# Patient Record
Sex: Male | Born: 1960 | Race: White | Hispanic: No | Marital: Married | Smoking: Former smoker
Health system: Southern US, Community
[De-identification: ages and names within clinical notes are randomized; demographics above are authoritative.]

## PROBLEM LIST (undated history)

## (undated) DIAGNOSIS — G459 Transient cerebral ischemic attack, unspecified: Secondary | ICD-10-CM

## (undated) DIAGNOSIS — I10 Essential (primary) hypertension: Secondary | ICD-10-CM

## (undated) DIAGNOSIS — M549 Dorsalgia, unspecified: Secondary | ICD-10-CM

## (undated) DIAGNOSIS — M67431 Ganglion, right wrist: Secondary | ICD-10-CM

## (undated) DIAGNOSIS — I700 Atherosclerosis of aorta: Secondary | ICD-10-CM

## (undated) DIAGNOSIS — R0600 Dyspnea, unspecified: Secondary | ICD-10-CM

## (undated) DIAGNOSIS — I639 Cerebral infarction, unspecified: Secondary | ICD-10-CM

## (undated) DIAGNOSIS — J45909 Unspecified asthma, uncomplicated: Secondary | ICD-10-CM

## (undated) DIAGNOSIS — F419 Anxiety disorder, unspecified: Secondary | ICD-10-CM

## (undated) DIAGNOSIS — G5601 Carpal tunnel syndrome, right upper limb: Secondary | ICD-10-CM

## (undated) DIAGNOSIS — R200 Anesthesia of skin: Secondary | ICD-10-CM

## (undated) DIAGNOSIS — G4730 Sleep apnea, unspecified: Secondary | ICD-10-CM

## (undated) DIAGNOSIS — F32A Depression, unspecified: Secondary | ICD-10-CM

## (undated) DIAGNOSIS — F329 Major depressive disorder, single episode, unspecified: Secondary | ICD-10-CM

## (undated) DIAGNOSIS — I2510 Atherosclerotic heart disease of native coronary artery without angina pectoris: Secondary | ICD-10-CM

## (undated) DIAGNOSIS — M47816 Spondylosis without myelopathy or radiculopathy, lumbar region: Secondary | ICD-10-CM

## (undated) DIAGNOSIS — E785 Hyperlipidemia, unspecified: Secondary | ICD-10-CM

## (undated) DIAGNOSIS — M1990 Unspecified osteoarthritis, unspecified site: Secondary | ICD-10-CM

## (undated) DIAGNOSIS — G43909 Migraine, unspecified, not intractable, without status migrainosus: Secondary | ICD-10-CM

## (undated) DIAGNOSIS — J189 Pneumonia, unspecified organism: Secondary | ICD-10-CM

## (undated) DIAGNOSIS — J309 Allergic rhinitis, unspecified: Secondary | ICD-10-CM

## (undated) DIAGNOSIS — J449 Chronic obstructive pulmonary disease, unspecified: Secondary | ICD-10-CM

## (undated) DIAGNOSIS — G8929 Other chronic pain: Secondary | ICD-10-CM

## (undated) HISTORY — PX: KNEE SURGERY: SHX244

## (undated) HISTORY — PX: LUMBAR DISC SURGERY: SHX700

## (undated) HISTORY — PX: LAMINECTOMY: SHX219

## (undated) HISTORY — PX: CARPAL TUNNEL RELEASE: SHX101

---

## 1980-02-24 HISTORY — PX: FACIAL RECONSTRUCTION SURGERY: SHX631

## 1990-02-23 HISTORY — PX: CARPAL TUNNEL RELEASE: SHX101

## 1993-02-23 HISTORY — PX: KNEE ARTHROSCOPY W/ MENISCAL REPAIR: SHX1877

## 2003-07-06 ENCOUNTER — Other Ambulatory Visit: Payer: Self-pay

## 2004-07-30 ENCOUNTER — Other Ambulatory Visit: Payer: Self-pay

## 2004-07-30 ENCOUNTER — Emergency Department: Payer: Self-pay | Admitting: Emergency Medicine

## 2005-07-23 ENCOUNTER — Inpatient Hospital Stay: Payer: Self-pay | Admitting: Internal Medicine

## 2005-07-23 ENCOUNTER — Other Ambulatory Visit: Payer: Self-pay

## 2005-08-07 ENCOUNTER — Ambulatory Visit: Payer: Self-pay | Admitting: Internal Medicine

## 2005-12-09 ENCOUNTER — Other Ambulatory Visit: Payer: Self-pay

## 2005-12-09 ENCOUNTER — Emergency Department: Payer: Self-pay | Admitting: Emergency Medicine

## 2006-05-10 ENCOUNTER — Emergency Department: Payer: Self-pay | Admitting: Emergency Medicine

## 2006-05-10 ENCOUNTER — Other Ambulatory Visit: Payer: Self-pay

## 2006-07-31 ENCOUNTER — Emergency Department: Payer: Self-pay | Admitting: Emergency Medicine

## 2006-07-31 ENCOUNTER — Other Ambulatory Visit: Payer: Self-pay

## 2006-10-03 ENCOUNTER — Emergency Department: Payer: Self-pay | Admitting: Emergency Medicine

## 2006-10-03 ENCOUNTER — Other Ambulatory Visit: Payer: Self-pay

## 2007-07-06 ENCOUNTER — Other Ambulatory Visit: Payer: Self-pay

## 2007-07-06 ENCOUNTER — Emergency Department: Payer: Self-pay | Admitting: Emergency Medicine

## 2007-07-24 ENCOUNTER — Emergency Department: Payer: Self-pay | Admitting: Emergency Medicine

## 2007-07-26 ENCOUNTER — Emergency Department: Payer: Self-pay | Admitting: Emergency Medicine

## 2007-09-01 ENCOUNTER — Emergency Department: Payer: Self-pay | Admitting: Emergency Medicine

## 2007-10-05 ENCOUNTER — Emergency Department: Payer: Self-pay | Admitting: Emergency Medicine

## 2008-02-24 HISTORY — PX: LUMBAR DISC SURGERY: SHX700

## 2008-07-06 ENCOUNTER — Emergency Department: Payer: Self-pay | Admitting: Internal Medicine

## 2009-04-11 ENCOUNTER — Ambulatory Visit: Payer: Self-pay | Admitting: Family Medicine

## 2010-04-23 ENCOUNTER — Inpatient Hospital Stay: Payer: Self-pay | Admitting: Specialist

## 2010-04-28 ENCOUNTER — Ambulatory Visit: Payer: Self-pay | Admitting: Specialist

## 2010-06-16 ENCOUNTER — Emergency Department: Payer: Self-pay | Admitting: Emergency Medicine

## 2010-09-11 ENCOUNTER — Ambulatory Visit: Payer: Self-pay | Admitting: Family Medicine

## 2010-10-13 ENCOUNTER — Emergency Department: Payer: Self-pay | Admitting: Emergency Medicine

## 2012-11-10 ENCOUNTER — Emergency Department: Payer: Self-pay | Admitting: Emergency Medicine

## 2012-11-20 ENCOUNTER — Emergency Department: Payer: Self-pay | Admitting: Emergency Medicine

## 2013-02-23 DIAGNOSIS — G459 Transient cerebral ischemic attack, unspecified: Secondary | ICD-10-CM

## 2013-02-23 HISTORY — DX: Transient cerebral ischemic attack, unspecified: G45.9

## 2013-10-12 ENCOUNTER — Emergency Department: Payer: Self-pay | Admitting: Emergency Medicine

## 2014-10-21 ENCOUNTER — Emergency Department
Admission: EM | Admit: 2014-10-21 | Discharge: 2014-10-21 | Disposition: A | Discharge status: Home / Self Care | Payer: Self-pay | Attending: Emergency Medicine | Admitting: Emergency Medicine

## 2014-10-21 DIAGNOSIS — H6091 Unspecified otitis externa, right ear: Secondary | ICD-10-CM | POA: Insufficient documentation

## 2014-10-21 DIAGNOSIS — J449 Chronic obstructive pulmonary disease, unspecified: Secondary | ICD-10-CM | POA: Insufficient documentation

## 2014-10-21 MED ORDER — CIPROFLOXACIN HCL 500 MG PO TABS: 500.0000 mg | ORAL_TABLET | Freq: Two times a day (BID) | ORAL | Status: AC

## 2014-10-21 MED ORDER — NEOMYCIN-POLYMYXIN-HC 3.5-10000-1 OT SOLN: 3.0000 [drp] | Freq: Three times a day (TID) | OTIC | Status: AC

## 2014-10-21 MED ORDER — CIPROFLOXACIN HCL 500 MG PO TABS
500.0000 mg | ORAL_TABLET | Freq: Once | ORAL | Status: AC
  Administered 2014-10-21: 500 mg via ORAL
  Filled 2014-10-21: qty 1

## 2014-10-21 MED ORDER — IPRATROPIUM-ALBUTEROL 0.5-2.5 (3) MG/3ML IN SOLN
3.0000 mL | Freq: Once | RESPIRATORY_TRACT | Status: AC
  Administered 2014-10-21: 3 mL via RESPIRATORY_TRACT
  Filled 2014-10-21: qty 3

## 2014-10-21 MED ORDER — NEOMYCIN-POLYMYXIN-HC 3.5-10000-1 OT SUSP
OTIC | Status: AC
  Administered 2014-10-21: 06:00:00
  Filled 2014-10-21: qty 10

## 2014-10-21 MED ORDER — NEOMYCIN-COLIST-HC-THONZONIUM 3.3-3-10-0.5 MG/ML OT SUSP: 4.0000 [drp] | Freq: Once | OTIC | Status: DC

## 2014-10-21 MED ORDER — ALBUTEROL SULFATE HFA 108 (90 BASE) MCG/ACT IN AERS: 2.0000 | INHALATION_SPRAY | Freq: Four times a day (QID) | RESPIRATORY_TRACT | Status: DC | PRN

## 2014-10-21 NOTE — ED Provider Notes (Signed)
[facility] Emergency Department Provider Note  ____________________________________________  Time seen: Approximately 5:28 AM  I have reviewed the triage vital signs and the nursing notes.   HISTORY  Chief Complaint Otalgia and Cough   HPI Bruce Harding is a 54 y.o. male patient reports several days of right ear pain. He also has a cough which is not productive now he says in the past occasionally been productive of some phlegm. He has COPD. He does not have a fever.    No past medical history on file. past medical history significant for COPD   There are no active problems to display for this patient.   No past surgical history on file.  Current Outpatient Rx  Name  Route  Sig  Dispense  Refill  . albuterol (PROVENTIL HFA;VENTOLIN HFA) 108 (90 BASE) MCG/ACT inhaler   Inhalation   Inhale 2 puffs into the lungs every 6 (six) hours as needed for wheezing or shortness of breath.   1 Inhaler   2   . ciprofloxacin (CIPRO) 500 MG tablet   Oral   Take 1 tablet (500 mg total) by mouth 2 (two) times daily.   20 tablet   0   . neomycin-polymyxin-hydrocortisone (CORTISPORIN) otic solution   Left Ear   Place 3 drops into the left ear 3 (three) times daily.   10 mL   0     Allergies Review of patient's allergies indicates no known allergies.  No family history on file.  Social History Social History  Substance Use Topics  . Smoking status: Not on file  . Smokeless tobacco: Not on file  . Alcohol Use: Not on file   Patient reports he smokes  Review of Systems Constitutional: No fever/chills Eyes: No visual changes. ENT: No sore throat. Cardiovascular: Denies chest pain. Respiratory: Denies  unusual shortness of breath. Gastrointestinal: No abdominal pain.  No nausea, no vomiting.  No diarrhea.  No constipation. Genitourinary: Negative for dysuria. Musculoskeletal: Negative for back pain. Skin: Negative for rash. Neurological: Negative  for headaches, focal weakness or numbness.  10-point ROS otherwise negative.  ____________________________________________   PHYSICAL EXAM:  VITAL SIGNS: ED Triage Vitals  Enc Vitals Group     BP 10/21/14 0410 123/80 mmHg     Pulse Rate 10/21/14 0410 80     Resp 10/21/14 0410 18     Temp 10/21/14 0410 98 F (36.7 C)     Temp Source 10/21/14 0410 Oral     SpO2 10/21/14 0410 95 %     Weight 10/21/14 0410 167 lb (75.751 kg)     Height 10/21/14 0410 5\' 5"  (1.651 m)     Head Cir --      Peak Flow --      Pain Score 10/21/14 0408 8     Pain Loc --      Pain Edu? --      Excl. in GC? --   Constitutional: Alert and oriented. Well appearing and in no acute distress. Eyes: Conjunctivae are normal. PERRL. EOMI. Head: Atraumatic. Nose: No congestion/rhinnorhea. Ears left TM is normal right TM is normal as well however the right ear canal is swollen the ears tender to traction is also some swelling around the ear  Mouth/Throat: Mucous membranes are moist.  Oropharynx non-erythematous. Neck: No stridor. Cardiovascular: Normal rate, regular rhythm. Grossly normal heart sounds.  Good peripheral circulation. Respiratory: Normal respiratory effort.  No retractions. Lungs CTAB. Gastrointestinal: Soft and nontender. No distention. No abdominal  bruits. No CVA tenderness. Musculoskeletal: No lower extremity tenderness nor edema.  No joint effusions. Neurologic:  Normal speech and language. No gross focal neurologic deficits are appreciated. No gait instability. Skin:  Skin is warm, dry and intact. No rash noted. Psychiatric: Mood and affect are normal. Speech and behavior are normal.  ____________________________________________   LABS (all labs ordered are listed, but only abnormal results are displayed)  Labs Reviewed - No data to  display ____________________________________________  EKG   ____________________________________________  RADIOLOGY   ____________________________________________   PROCEDURES    ____________________________________________   INITIAL IMPRESSION / ASSESSMENT AND PLAN / ED COURSE  Pertinent labs & imaging results that were available during my care of the patient were reviewed by me and considered in my medical decision making (see chart for details).  I discussed mostly smoking sensation with the patient. I discussed using a wick and demonstrated the same with the patient telling him to change the wick every day and make it out of cotton or tissue paper ____________________________________________   FINAL CLINICAL IMPRESSION(S) / ED DIAGNOSES  Final diagnoses:  Otitis externa, right  Chronic obstructive pulmonary disease, unspecified COPD, unspecified chronic bronchitis type      Arnaldo Natal, MD 10/21/14 0530

## 2014-10-21 NOTE — ED Notes (Signed)
Pt c/o right earache for 3 days;

## 2014-10-21 NOTE — ED Notes (Signed)
Pt. States rt. Ear pain for past 3 days.  Pt. States hx of ear infections.  Pt. States tube put in rt. Ear 5-6 years ago.  Pt. Denies swimming.

## 2015-07-24 ENCOUNTER — Emergency Department
Admission: EM | Admit: 2015-07-24 | Discharge: 2015-07-24 | Disposition: A | Discharge status: Home / Self Care | Payer: BLUE CROSS/BLUE SHIELD | Attending: Emergency Medicine | Admitting: Emergency Medicine

## 2015-07-24 ENCOUNTER — Encounter: Payer: Self-pay | Admitting: Emergency Medicine

## 2015-07-24 ENCOUNTER — Emergency Department: Payer: BLUE CROSS/BLUE SHIELD

## 2015-07-24 DIAGNOSIS — J45909 Unspecified asthma, uncomplicated: Secondary | ICD-10-CM | POA: Diagnosis not present

## 2015-07-24 DIAGNOSIS — F329 Major depressive disorder, single episode, unspecified: Secondary | ICD-10-CM | POA: Diagnosis not present

## 2015-07-24 DIAGNOSIS — Z79899 Other long term (current) drug therapy: Secondary | ICD-10-CM | POA: Insufficient documentation

## 2015-07-24 DIAGNOSIS — H6091 Unspecified otitis externa, right ear: Secondary | ICD-10-CM | POA: Diagnosis not present

## 2015-07-24 DIAGNOSIS — Z7951 Long term (current) use of inhaled steroids: Secondary | ICD-10-CM | POA: Insufficient documentation

## 2015-07-24 DIAGNOSIS — J449 Chronic obstructive pulmonary disease, unspecified: Secondary | ICD-10-CM | POA: Diagnosis not present

## 2015-07-24 DIAGNOSIS — R51 Headache: Secondary | ICD-10-CM | POA: Diagnosis present

## 2015-07-24 DIAGNOSIS — F17210 Nicotine dependence, cigarettes, uncomplicated: Secondary | ICD-10-CM | POA: Insufficient documentation

## 2015-07-24 HISTORY — DX: Major depressive disorder, single episode, unspecified: F32.9

## 2015-07-24 HISTORY — DX: Dorsalgia, unspecified: M54.9

## 2015-07-24 HISTORY — DX: Chronic obstructive pulmonary disease, unspecified: J44.9

## 2015-07-24 HISTORY — DX: Depression, unspecified: F32.A

## 2015-07-24 HISTORY — DX: Allergic rhinitis, unspecified: J30.9

## 2015-07-24 HISTORY — DX: Anxiety disorder, unspecified: F41.9

## 2015-07-24 HISTORY — DX: Unspecified asthma, uncomplicated: J45.909

## 2015-07-24 HISTORY — DX: Other chronic pain: G89.29

## 2015-07-24 LAB — COMPREHENSIVE METABOLIC PANEL
ALK PHOS: 69 U/L (ref 38–126)
ALT: 13 U/L — AB (ref 17–63)
AST: 15 U/L (ref 15–41)
Albumin: 4 g/dL (ref 3.5–5.0)
Anion gap: 7 (ref 5–15)
BILIRUBIN TOTAL: 0.9 mg/dL (ref 0.3–1.2)
BUN: 12 mg/dL (ref 6–20)
CALCIUM: 9 mg/dL (ref 8.9–10.3)
CO2: 24 mmol/L (ref 22–32)
CREATININE: 0.69 mg/dL (ref 0.61–1.24)
Chloride: 107 mmol/L (ref 101–111)
Glucose, Bld: 93 mg/dL (ref 65–99)
Potassium: 3.8 mmol/L (ref 3.5–5.1)
Sodium: 138 mmol/L (ref 135–145)
Total Protein: 7.1 g/dL (ref 6.5–8.1)

## 2015-07-24 LAB — CBC
HCT: 44.9 % (ref 40.0–52.0)
HEMOGLOBIN: 15.3 g/dL (ref 13.0–18.0)
MCH: 29.6 pg (ref 26.0–34.0)
MCHC: 34.1 g/dL (ref 32.0–36.0)
MCV: 86.8 fL (ref 80.0–100.0)
PLATELETS: 206 10*3/uL (ref 150–440)
RBC: 5.17 MIL/uL (ref 4.40–5.90)
RDW: 13.4 % (ref 11.5–14.5)
WBC: 12.4 10*3/uL — ABNORMAL HIGH (ref 3.8–10.6)

## 2015-07-24 LAB — TROPONIN I

## 2015-07-24 MED ORDER — OXYCODONE-ACETAMINOPHEN 5-325 MG PO TABS
1.0000 | ORAL_TABLET | Freq: Once | ORAL | Status: AC
  Administered 2015-07-24: 1 via ORAL
  Filled 2015-07-24: qty 1

## 2015-07-24 MED ORDER — OFLOXACIN 0.3 % OP SOLN: OPHTHALMIC | Status: AC

## 2015-07-24 NOTE — Discharge Instructions (Signed)
You have been seen in the emergency department today for chest pain. Your workup has shown normal results besides a right external ear infection. As we discussed please follow-up with your primary care physician in the next 1-2 days for recheck. Return to the emergency department for any further chest pain, trouble breathing, or any other symptom personally concerning to yourself.   Ear Drops, Adult You have been diagnosed with a condition requiring you to put drops of medicine into your outer ear. HOME CARE INSTRUCTIONS   Put drops in the affected ear as instructed. After putting the drops in, you will need to lie down with the affected ear facing up for ten minutes so the drops will remain in the ear canal and run down and fill the canal. Continue using the ear drops for as long as directed by your health care provider.  Prior to getting up, put a cotton ball gently in your ear canal. Leave enough of the cotton ball out so it can be easily removed. Do not attempt to push this down into the canal with a cotton-tipped swab or other instrument.  Do not irrigate or wash out your ears if you have had a perforated eardrum or mastoid surgery, or unless instructed to do so by your health care provider.  Keep appointments with your health care provider as instructed.  Finish all medicine, or use for the length of time prescribed by your health care provider. Continue the drops even if your problem seems to be doing well after a couple days, or continue as instructed. SEEK MEDICAL CARE IF:  You become worse or develop increasing pain.  You notice any unusual drainage from your ear (particularly if the drainage has a bad smell).  You develop hearing difficulties.  You experience a serious form of dizziness in which you feel as if the room is spinning, and you feel nauseated (vertigo).  The outside of your ear becomes red or swollen or both. This may be a sign of an allergic reaction. MAKE SURE YOU:    Understand these instructions.  Will watch your condition.  Will get help right away if you are not doing well or get worse.   This information is not intended to replace advice given to you by your health care provider. Make sure you discuss any questions you have with your health care provider.   Document Released: 02/03/2001 Document Revised: 03/02/2014 Document Reviewed: 09/06/2012 Elsevier Interactive Patient Education 2016 Elsevier Inc[employer]  Otitis Externa Otitis externa is a germ infection in the outer ear. The outer ear is the area from the eardrum to the outside of the ear. Otitis externa is sometimes called "swimmer's ear." HOME CARE  Put drops in the ear as told by your doctor.  Only take medicine as told by your doctor.  If you have diabetes, your doctor may give you more directions. Follow your doctor's directions.  Keep all doctor visits as told. To avoid another infection:  Keep your ear dry. Use the corner of a towel to dry your ear after swimming or bathing.  Avoid scratching or putting things inside your ear.  Avoid swimming in lakes, dirty water, or pools that use a chemical called chlorine poorly.  You may use ear drops after swimming. Combine equal amounts of white vinegar and alcohol in a bottle. Put 3 or 4 drops in each ear. GET HELP IF:   You have a fever.  Your ear is still red, puffy (swollen), or painful after  3 days.  You still have yellowish-white fluid (pus) coming from the ear after 3 days.  Your redness, puffiness, or pain gets worse.  You have a really bad headache.  You have redness, puffiness, pain, or tenderness behind your ear. MAKE SURE YOU:   Understand these instructions.  Will watch your condition.  Will get help right away if you are not doing well or get worse.   This information is not intended to replace advice given to you by your health care provider. Make sure you discuss any questions you have with your health care  provider.   Document Released: 07/29/2007 Document Revised: 03/02/2014 Document Reviewed: 02/26/2011 Elsevier Interactive Patient Education [employer] Inc[employer]

## 2015-07-24 NOTE — ED Provider Notes (Signed)
[facility] Emergency Department Provider Note  Time seen: 12:26 PM  I have reviewed the triage vital signs and the nursing notes.   HISTORY  Chief Complaint Chest Pain and Headache    HPI Bruce JacobsenFrankie V Harding is a 55 y.o. male with a past medical history of COPD, asthma, anxiety, depression, presents the emergency department with right ear pain and chest pain. According to the patient for the past several days he has been experiencing right ear pain. He went to [facility] this morning to be seen for his right ear pain, and during evaluation stated he was also experiencing intermittent chest pain since this morning. Patient also complaining of headache, neck pain and back pain. States a history of chronic back pain which he takes oxycodone for, but he has not taken it today. Describes the chest pain is somewhat sharp pain to the middle of the chest, somewhat worse with movement, cough or inspiration. No leg pain or swelling. No history of DVT. Patient does smoke cigarettes.     Past Medical History  Diagnosis Date  . COPD (chronic obstructive pulmonary disease) (HCC)   . Chronic back pain   . Asthma   . Anxiety   . Depression   . Allergic rhinitis     There are no active problems to display for this patient.   Past Surgical History  Procedure Laterality Date  . Knee surgery    . Carpal tunnel release    . Laminectomy      Current Outpatient Rx  Name  Route  Sig  Dispense  Refill  . albuterol (PROVENTIL HFA;VENTOLIN HFA) 108 (90 BASE) MCG/ACT inhaler   Inhalation   Inhale 2 puffs into the lungs every 6 (six) hours as needed for wheezing or shortness of breath.   1 Inhaler   2     Allergies Review of patient's allergies indicates no known allergies.  History reviewed. No pertinent family history.  Social History Social History  Substance Use Topics  . Smoking status: Current Every Day Smoker -- 0.50 packs/day    Types: Cigarettes  .  Smokeless tobacco: Never Used  . Alcohol Use: No    Review of Systems Constitutional: Negative for fever.Positive for right ear pain. Cardiovascular: Positive for chest pain. Respiratory: Negative for shortness of breath. Gastrointestinal: Negative for abdominal pain Genitourinary: Negative for dysuria. Musculoskeletal: Positive for neck and back pain, patient states chronic pain to these areas. Neurological: Mild headache. 10-point ROS otherwise negative.  ____________________________________________   PHYSICAL EXAM:  VITAL SIGNS: ED Triage Vitals  Enc Vitals Group     BP 07/24/15 1159 133/77 mmHg     Pulse Rate 07/24/15 1159 88     Resp 07/24/15 1159 20     Temp 07/24/15 1159 97.9 F (36.6 C)     Temp Source 07/24/15 1159 Oral     SpO2 07/24/15 1159 97 %     Weight 07/24/15 1159 167 lb (75.751 kg)     Height 07/24/15 1159 5\' 11"  (1.803 m)     Head Cir --      Peak Flow --      Pain Score 07/24/15 1151 9     Pain Loc --      Pain Edu? --      Excl. in GC? --     Constitutional: Alert and oriented. Well appearing and in no distress. Eyes: Normal exam ENT   Head: Normocephalic and atraumatic.   Mouth/Throat: Mucous membranes are moist.  Cardiovascular: Normal rate, regular rhythm. No murmur Respiratory: Normal respiratory effort without tachypnea nor retractions. Mild expiratory wheeze bilaterally. Gastrointestinal: Soft and nontender. No distention.   Musculoskeletal: Nontender with normal range of motion in all extremities.  Neurologic:  Normal speech and language. No gross focal neurologic deficits Skin:  Skin is warm, dry and intact.  Psychiatric: Mood and affect are normal.  ____________________________________________    EKG  EKG reviewed and interpreted by myself shows normal sinus rhythm at 79 bpm, narrow QRS, normal axis, normal intervals, no concerning ST changes.  ____________________________________________    RADIOLOGY  Chest x-ray  negative  ____________________________________________   INITIAL IMPRESSION / ASSESSMENT AND PLAN / ED COURSE  Pertinent labs & imaging results that were available during my care of the patient were reviewed by me and considered in my medical decision making (see chart for details).  Patient presents with right ear pain, and now chest pain intermittently since around 9:00 this morning. Patient does have expiratory wheezes bilaterally, states a history of COPD, patient smokes daily. On exam patient does appear to have a right otitis externa. Mild expiratory wheeze bilaterally but otherwise normal physical exam. We will treat with a DuoNeb, obtain labs including troponin, chest x-ray. EKG appears well.  Patient's labs are within normal limits including a negative troponin. We will repeat a troponin 2 hours after the initial troponin was drawn. Patient denies any chest pain at this time but does state a headache which started after receiving nitroglycerin by EMS. We will dose him additional Percocet and continue to monitor in the emergency department while awaiting the second troponin and chest x-ray results.  X-rays negative. Repeat troponin is negative. We will discharge the patient home with topical antibiotics for a right otitis externa. I provided information for cardiology to follow up for a stress test as soon as possible. Patient able to plan. ____________________________________________   FINAL CLINICAL IMPRESSION(S) / ED DIAGNOSES  Right otitis externa Chest pain   Minna Antis, MD 07/24/15 [phone]

## 2015-07-24 NOTE — ED Notes (Addendum)
Pt presents today via [facility] from [facility] where he was originally seen for ear pain per EMS. Per EMS while patient was at [facility] he developed CP, substernal, sharp, worsens with inspirations. Pt also c/o HA that is worse than CP with a hx of TIA. EMS gave 3 nitro tabs en route per patient.

## 2017-10-12 ENCOUNTER — Encounter: Payer: Self-pay | Admitting: *Deleted

## 2017-10-12 ENCOUNTER — Other Ambulatory Visit: Payer: Self-pay

## 2017-10-13 ENCOUNTER — Encounter
Admission: RE | Disposition: A | Discharge status: Home / Self Care | Payer: Self-pay | Source: Ambulatory Visit | Attending: Surgery

## 2017-10-13 ENCOUNTER — Ambulatory Visit
Admission: RE | Admit: 2017-10-13 | Discharge: 2017-10-13 | Disposition: A | Discharge status: Home / Self Care | Payer: BLUE CROSS/BLUE SHIELD | Source: Ambulatory Visit | Attending: Surgery | Admitting: Surgery

## 2017-10-13 ENCOUNTER — Ambulatory Visit: Payer: BLUE CROSS/BLUE SHIELD | Admitting: Anesthesiology

## 2017-10-13 DIAGNOSIS — Z8673 Personal history of transient ischemic attack (TIA), and cerebral infarction without residual deficits: Secondary | ICD-10-CM | POA: Insufficient documentation

## 2017-10-13 DIAGNOSIS — J449 Chronic obstructive pulmonary disease, unspecified: Secondary | ICD-10-CM | POA: Diagnosis not present

## 2017-10-13 DIAGNOSIS — G5601 Carpal tunnel syndrome, right upper limb: Secondary | ICD-10-CM | POA: Diagnosis present

## 2017-10-13 DIAGNOSIS — I10 Essential (primary) hypertension: Secondary | ICD-10-CM | POA: Diagnosis not present

## 2017-10-13 DIAGNOSIS — G4730 Sleep apnea, unspecified: Secondary | ICD-10-CM | POA: Insufficient documentation

## 2017-10-13 DIAGNOSIS — Z6828 Body mass index (BMI) 28.0-28.9, adult: Secondary | ICD-10-CM | POA: Diagnosis not present

## 2017-10-13 DIAGNOSIS — M67431 Ganglion, right wrist: Secondary | ICD-10-CM | POA: Diagnosis not present

## 2017-10-13 DIAGNOSIS — E663 Overweight: Secondary | ICD-10-CM | POA: Diagnosis not present

## 2017-10-13 HISTORY — PX: GANGLION CYST EXCISION: SHX1691

## 2017-10-13 HISTORY — DX: Sleep apnea, unspecified: G47.30

## 2017-10-13 HISTORY — DX: Anesthesia of skin: R20.0

## 2017-10-13 HISTORY — DX: Unspecified osteoarthritis, unspecified site: M19.90

## 2017-10-13 HISTORY — DX: Migraine, unspecified, not intractable, without status migrainosus: G43.909

## 2017-10-13 HISTORY — DX: Transient cerebral ischemic attack, unspecified: G45.9

## 2017-10-13 HISTORY — PX: CARPAL TUNNEL RELEASE: SHX101

## 2017-10-13 HISTORY — DX: Dyspnea, unspecified: R06.00

## 2017-10-13 SURGERY — CARPAL TUNNEL RELEASE
Anesthesia: Regional | Site: Wrist | Laterality: Right | Wound class: Clean

## 2017-10-13 MED ORDER — ACETAMINOPHEN 160 MG/5ML PO SOLN: 325.0000 mg | ORAL | Status: DC | PRN

## 2017-10-13 MED ORDER — MIDAZOLAM HCL 5 MG/5ML IJ SOLN
INTRAMUSCULAR | Status: DC | PRN
  Administered 2017-10-13: 2 mg via INTRAVENOUS

## 2017-10-13 MED ORDER — LACTATED RINGERS IV SOLN
INTRAVENOUS | Status: DC
  Administered 2017-10-13: 10:00:00 via INTRAVENOUS

## 2017-10-13 MED ORDER — ONDANSETRON HCL 4 MG/2ML IJ SOLN: 4.0000 mg | Freq: Once | INTRAMUSCULAR | Status: DC | PRN

## 2017-10-13 MED ORDER — PROPOFOL 500 MG/50ML IV EMUL
INTRAVENOUS | Status: DC | PRN
  Administered 2017-10-13: 75 ug/kg/min via INTRAVENOUS

## 2017-10-13 MED ORDER — FENTANYL CITRATE (PF) 100 MCG/2ML IJ SOLN: 25.0000 ug | INTRAMUSCULAR | Status: DC | PRN

## 2017-10-13 MED ORDER — OXYCODONE HCL 5 MG PO TABS: 5.0000 mg | ORAL_TABLET | Freq: Once | ORAL | Status: DC | PRN

## 2017-10-13 MED ORDER — OXYCODONE HCL 5 MG/5ML PO SOLN: 5.0000 mg | Freq: Once | ORAL | Status: DC | PRN

## 2017-10-13 MED ORDER — CEFAZOLIN SODIUM-DEXTROSE 2-4 GM/100ML-% IV SOLN
2.0000 g | Freq: Once | INTRAVENOUS | Status: AC
  Administered 2017-10-13: 2 g via INTRAVENOUS

## 2017-10-13 MED ORDER — LIDOCAINE HCL (CARDIAC) PF 100 MG/5ML IV SOSY
PREFILLED_SYRINGE | INTRAVENOUS | Status: DC | PRN
  Administered 2017-10-13: 40 mg via INTRAVENOUS

## 2017-10-13 MED ORDER — BUPIVACAINE HCL (PF) 0.5 % IJ SOLN
INTRAMUSCULAR | Status: DC | PRN
  Administered 2017-10-13: 10 mL

## 2017-10-13 MED ORDER — FENTANYL CITRATE (PF) 100 MCG/2ML IJ SOLN
INTRAMUSCULAR | Status: DC | PRN
  Administered 2017-10-13 (×2): 50 ug via INTRAVENOUS

## 2017-10-13 MED ORDER — ACETAMINOPHEN 325 MG PO TABS: 325.0000 mg | ORAL_TABLET | ORAL | Status: DC | PRN

## 2017-10-13 MED ORDER — OXYCODONE-ACETAMINOPHEN 10-325 MG PO TABS: 1.0000 | ORAL_TABLET | Freq: Four times a day (QID) | ORAL | 0 refills | Status: AC | PRN

## 2017-10-13 SURGICAL SUPPLY — 25 items
BANDAGE ELASTIC 3 LF NS (GAUZE/BANDAGES/DRESSINGS) ×3 IMPLANT
BNDG COHESIVE 4X5 TAN STRL (GAUZE/BANDAGES/DRESSINGS) ×3 IMPLANT
BNDG ESMARK 4X12 TAN STRL LF (GAUZE/BANDAGES/DRESSINGS) ×3 IMPLANT
CHLORAPREP W/TINT 26ML (MISCELLANEOUS) ×3 IMPLANT
CORD BIP STRL DISP 12FT (MISCELLANEOUS) ×3 IMPLANT
COVER LIGHT HANDLE UNIVERSAL (MISCELLANEOUS) ×6 IMPLANT
CUFF TOURNIQUET DUAL PORT 18X3 (MISCELLANEOUS) ×3 IMPLANT
DRAPE SURG 17X11 SM STRL (DRAPES) ×3 IMPLANT
GAUZE PETRO XEROFOAM 1X8 (MISCELLANEOUS) ×3 IMPLANT
GAUZE SPONGE 4X4 12PLY STRL (GAUZE/BANDAGES/DRESSINGS) ×3 IMPLANT
GLOVE BIO SURGEON STRL SZ8 (GLOVE) ×6 IMPLANT
GLOVE INDICATOR 8.0 STRL GRN (GLOVE) ×3 IMPLANT
GOWN STRL REUS W/ TWL LRG LVL3 (GOWN DISPOSABLE) ×1 IMPLANT
GOWN STRL REUS W/ TWL XL LVL3 (GOWN DISPOSABLE) ×1 IMPLANT
GOWN STRL REUS W/TWL LRG LVL3 (GOWN DISPOSABLE) ×2
GOWN STRL REUS W/TWL XL LVL3 (GOWN DISPOSABLE) ×2
KIT CARPAL TUNNEL (MISCELLANEOUS) ×2
KIT ESCP INSRT D SLOT CANN KN (MISCELLANEOUS) ×1 IMPLANT
KIT TURNOVER KIT A (KITS) ×3 IMPLANT
NS IRRIG 500ML POUR BTL (IV SOLUTION) ×3 IMPLANT
PACK EXTREMITY ARMC (MISCELLANEOUS) ×3 IMPLANT
STOCKINETTE IMPERVIOUS 9X36 MD (GAUZE/BANDAGES/DRESSINGS) ×3 IMPLANT
STRAP BODY AND KNEE 60X3 (MISCELLANEOUS) ×3 IMPLANT
SUT PROLENE 4 0 PS 2 18 (SUTURE) ×6 IMPLANT
SUT PROLENE 6 0 P 1 18 (SUTURE) ×3 IMPLANT

## 2017-10-13 NOTE — Anesthesia Procedure Notes (Addendum)
Anesthesia Regional Block: Bier block ([facility] Regional)   Pre-Anesthetic Checklist: ,, timeout performed, Correct Patient, Correct Site, Correct Laterality, Correct Procedure, Correct Position, site marked, Risks and benefits discussed,  Surgical consent,  Pre-op evaluation,  At surgeon's request and post-op pain management  Laterality: Right      Procedures:,,,,, intact distal pulses, Esmarch exsanguination,, #20gu IV placed and double tourniquet utilized  Narrative:  Start time: 10/13/2017 10:49 AM End time: 10/13/2017 10:50 AM Injection made incrementally with aspirations every 5 mL.  Performed by: With CRNAs  Anesthesiologist: Ranee GosselinStella, , MD CRNA: Jimmy PicketAmyot, , CRNA

## 2017-10-13 NOTE — H&P (Signed)
Paper H&P to be scanned into permanent record. H&P reviewed and patient re-examined. No changes. 

## 2017-10-13 NOTE — Anesthesia Postprocedure Evaluation (Signed)
Anesthesia Post Note  Patient: Bruce JacobsenFrankie V Harding  Procedure(s) Performed: CARPAL TUNNEL RELEASE (Right Wrist) REMOVAL GANGLION OF WRIST (Right Wrist)  Patient location during evaluation: PACU Anesthesia Type: Bier Block Level of consciousness: awake and alert and oriented Pain management: satisfactory to patient Vital Signs Assessment: post-procedure vital signs reviewed and stable Respiratory status: spontaneous breathing, nonlabored ventilation and respiratory function stable Cardiovascular status: blood pressure returned to baseline and stable Postop Assessment: Adequate PO intake and No signs of nausea or vomiting Anesthetic complications: no    Cherly BeachStella,  J

## 2017-10-13 NOTE — Op Note (Signed)
10/13/2017  11:59 AM  Patient:   Bruce JacobsenFrankie V Harding  Pre-Op Diagnosis:   1.  Recurrent right carpal tunnel syndrome.  2.  Right volar carpal ganglion.  Post-Op Diagnosis:   Same.  Procedure:   1.  Open right carpal tunnel release.  2.  Excision of right volar carpal ganglion.  Surgeon:   Maryagnes AmosJ. Jeffrey Poggi, MD  Anesthesia:   Bier block  Findings:   As above.  Complications:   None  EBL:   1 cc  Fluids:   750 cc crystalloid  TT:   65 minutes at 250 mmHg  Drains:   None  Closure:   4-0 Prolene interrupted sutures  Brief Clinical Note:   The patient is a 57 year old male with a history of progressively worsening pain and paresthesias to his right hand. The patient had undergone an open right carpal tunnel release approximately 25 years ago from which he had done reasonably well, although he still complained of some residual pain and paresthesias.  His history and examination are consistent with recurrent carpal tunnel syndrome. His symptoms have present persisted despite medications, activity modification, a carpal tunnel injection, and splinting. The patient presents at this time for an open revision right carpal tunnel release. The patient also notes a small mass on the volar aspect of his right wrist, consistent with a volar carpal ganglion which appears to arise from the flexor carpi radialis tendon sheath. It had been aspirated once but has recurred.  He requests that this be removed as well.  Procedure:   The patient was brought into the operating room and lain in the supine position. A timeout was performed to verify the appropriate surgical site. After adequate IV sedation was achieved was achieved, a Bier block was placed by the anesthesiologist and the tourniquet inflated to 250 mmHg. The right hand and upper extremity were prepped with ChloraPrep solution before being draped sterilely. Preoperative antibiotics were administered. Utilizing the previous incision on the volar aspect  of the palm, the incision was extended proximally in a zigzag fashion over the wrist flexor crease into the distal forearm, centering the proximal end of the incision over the ganglion cyst. Proximally, the incision was carried down through the subcutaneous tissues with care taken to identify and protect any neurovascular structures. The cyst was identified and dissected out circumferentially before the cyst was removed in its entirety. A small amount of clear gelatinous fluid was released, confirming its identification as a ganglion cyst. The cyst appeared to arise from the flexor carpi radialis tendon sheath.   The median nerve was identified in the volar aspect of the distal forearm after penetrating the volar forearm fascia just proximal to the transverse carpal ligament.  The nerve was tracked distally beneath the reconstituted transverse carpal ligament.  The recurrent scar tissue and residual transverse carpal ligament was transected in line with the incision under direct visualization while protecting the median nerve using a #15 blade. Several adhesions along the ulnar side of the nerve were released using small Metzenbaum scissors to allow the nerve to move more freely within the carpal tunnel. Hemostasis was achieved using bipolar electrocautery.  During the initial dissection of the cyst, a small transected structure was encountered that was consistent with a cutaneous nerve, possibly the palmar cutaneous nerve.  Therefore, the proximal and distal ends of this fracture were identified and a primary repair performed using 6-0 Prolene interrupted sutures under loupe magnification.  The wound was irrigated thoroughly with sterile saline solution  before being closed using 4-0 Prolene interrupted sutures. A total of 10 cc of 0.5% plain Sensorcaine was injected in and around the incision before a sterile bulky dressing was applied to the wound. The patient was then awakened, extubated, and returned to  the recovery room in satisfactory condition after tolerating the procedure well.

## 2017-10-13 NOTE — Transfer of Care (Signed)
Immediate Anesthesia Transfer of Care Note  Patient: Bruce JacobsenFrankie V Harding  Procedure(s) Performed: CARPAL TUNNEL RELEASE (Right Wrist) REMOVAL GANGLION OF WRIST (Right Wrist)  Patient Location: PACU  Anesthesia Type: General, Bier Block  Level of Consciousness: awake, alert  and patient cooperative  Airway and Oxygen Therapy: Patient Spontanous Breathing and Patient connected to supplemental oxygen  Post-op Assessment: Post-op Vital signs reviewed, Patient's Cardiovascular Status Stable, Respiratory Function Stable, Patent Airway and No signs of Nausea or vomiting  Post-op Vital Signs: Reviewed and stable  Complications: No apparent anesthesia complications

## 2017-10-13 NOTE — Anesthesia Procedure Notes (Signed)
Performed by: , , CRNA Pre-anesthesia Checklist: Patient identified, Emergency Drugs available, Suction available, Patient being monitored and Timeout performed Patient Re-evaluated:Patient Re-evaluated prior to induction Oxygen Delivery Method: Simple face mask Placement Confirmation: positive ETCO2 and breath sounds checked- equal and bilateral       

## 2017-10-13 NOTE — Anesthesia Preprocedure Evaluation (Signed)
Anesthesia Evaluation  Patient identified by MRN, date of birth, ID band Patient awake    Reviewed: Allergy & Precautions, H&P , NPO status , Patient's Chart, lab work & pertinent test results  Airway Mallampati: II  TM Distance: >3 FB Neck ROM: full    Dental  (+) Poor Dentition, Chipped, Missing, Loose   Pulmonary sleep apnea , COPD,  COPD inhaler, former smoker,    Pulmonary exam normal breath sounds clear to auscultation       Cardiovascular hypertension, Normal cardiovascular exam Rhythm:regular Rate:Normal     Neuro/Psych PSYCHIATRIC DISORDERS TIA   GI/Hepatic   Endo/Other    Renal/GU      Musculoskeletal   Abdominal   Peds  Hematology   Anesthesia Other Findings   Reproductive/Obstetrics                             Anesthesia Physical Anesthesia Plan  ASA: III  Anesthesia Plan: General and Bier Block and Bier Block-Lidocaine Only   Post-op Pain Management:    Induction:   PONV Risk Score and Plan: 2 and Propofol infusion, Midazolam and Treatment may vary due to age or medical condition  Airway Management Planned:   Additional Equipment:   Intra-op Plan:   Post-operative Plan:   Informed Consent: I have reviewed the patients History and Physical, chart, labs and discussed the procedure including the risks, benefits and alternatives for the proposed anesthesia with the patient or authorized representative who has indicated his/her understanding and acceptance.     Plan Discussed with: CRNA  Anesthesia Plan Comments:         Anesthesia Quick Evaluation

## 2017-10-13 NOTE — Discharge Instructions (Addendum)
General Anesthesia, Adult, Care After These instructions provide you with information about caring for yourself after your procedure. Your health care provider may also give you more specific instructions. Your treatment has been planned according to current medical practices, but problems sometimes occur. Call your health care provider if you have any problems or questions after your procedure. What can I expect after the procedure? After the procedure, it is common to have:  Vomiting.  A sore throat.  Mental slowness.  It is common to feel:  Nauseous.  Cold or shivery.  Sleepy.  Tired.  Sore or achy, even in parts of your body where you did not have surgery.  Follow these instructions at home: For at least 24 hours after the procedure:  Do not: ? Participate in activities where you could fall or become injured. ? Drive. ? Use heavy machinery. ? Drink alcohol. ? Take sleeping pills or medicines that cause drowsiness. ? Make important decisions or sign legal documents. ? Take care of children on your own.  Rest. Eating and drinking  If you vomit, drink water, juice, or soup when you can drink without vomiting.  Drink enough fluid to keep your urine clear or pale yellow.  Make sure you have little or no nausea before eating solid foods.  Follow the diet recommended by your health care provider. General instructions  Have a responsible adult stay with you until you are awake and alert.  Return to your normal activities as told by your health care provider. Ask your health care provider what activities are safe for you.  Take over-the-counter and prescription medicines only as told by your health care provider.  If you smoke, do not smoke without supervision.  Keep all follow-up visits as told by your health care provider. This is important. Contact a health care provider if:  You continue to have nausea or vomiting at home, and medicines are not helpful.  You  cannot drink fluids or start eating again.  You cannot urinate after 8-12 hours.  You develop a skin rash.  You have fever.  You have increasing redness at the site of your procedure. Get help right away if:  You have difficulty breathing.  You have chest pain.  You have unexpected bleeding.  You feel that you are having a life-threatening or urgent problem. This information is not intended to replace advice given to you by your health care provider. Make sure you discuss any questions you have with your health care provider. Document Released: 05/18/2000 Document Revised: 07/15/2015 Document Reviewed: 01/24/2015 Elsevier Interactive Patient Education  2018 [employer]   Orthopedic discharge instructions: Keep dressing dry and intact. Keep hand elevated above heart level. May shower after dressing removed on postop day 4 (Sunday). Cover sutures with Band-Aids after drying off, then reapply Velcro splint over ace wrap. Apply ice to affected area frequently. May resume Naprosyn BID with meals. Take ES Tylenol or pain medication as prescribed when needed.  Return for follow-up in 10-14 days or as scheduled.

## 2017-10-14 ENCOUNTER — Encounter: Payer: Self-pay | Admitting: Surgery

## 2017-10-17 LAB — SURGICAL PATHOLOGY

## 2018-02-22 IMAGING — CR DG CHEST 2V
2 series · 2 of 2 positions shown · non-contrast
Comparison: 04/23/2010

CLINICAL DATA: Substernal chest pain, pleuritic.  Headache.  COPD.

EXAM:
CHEST  2 VIEW

[chest pa]
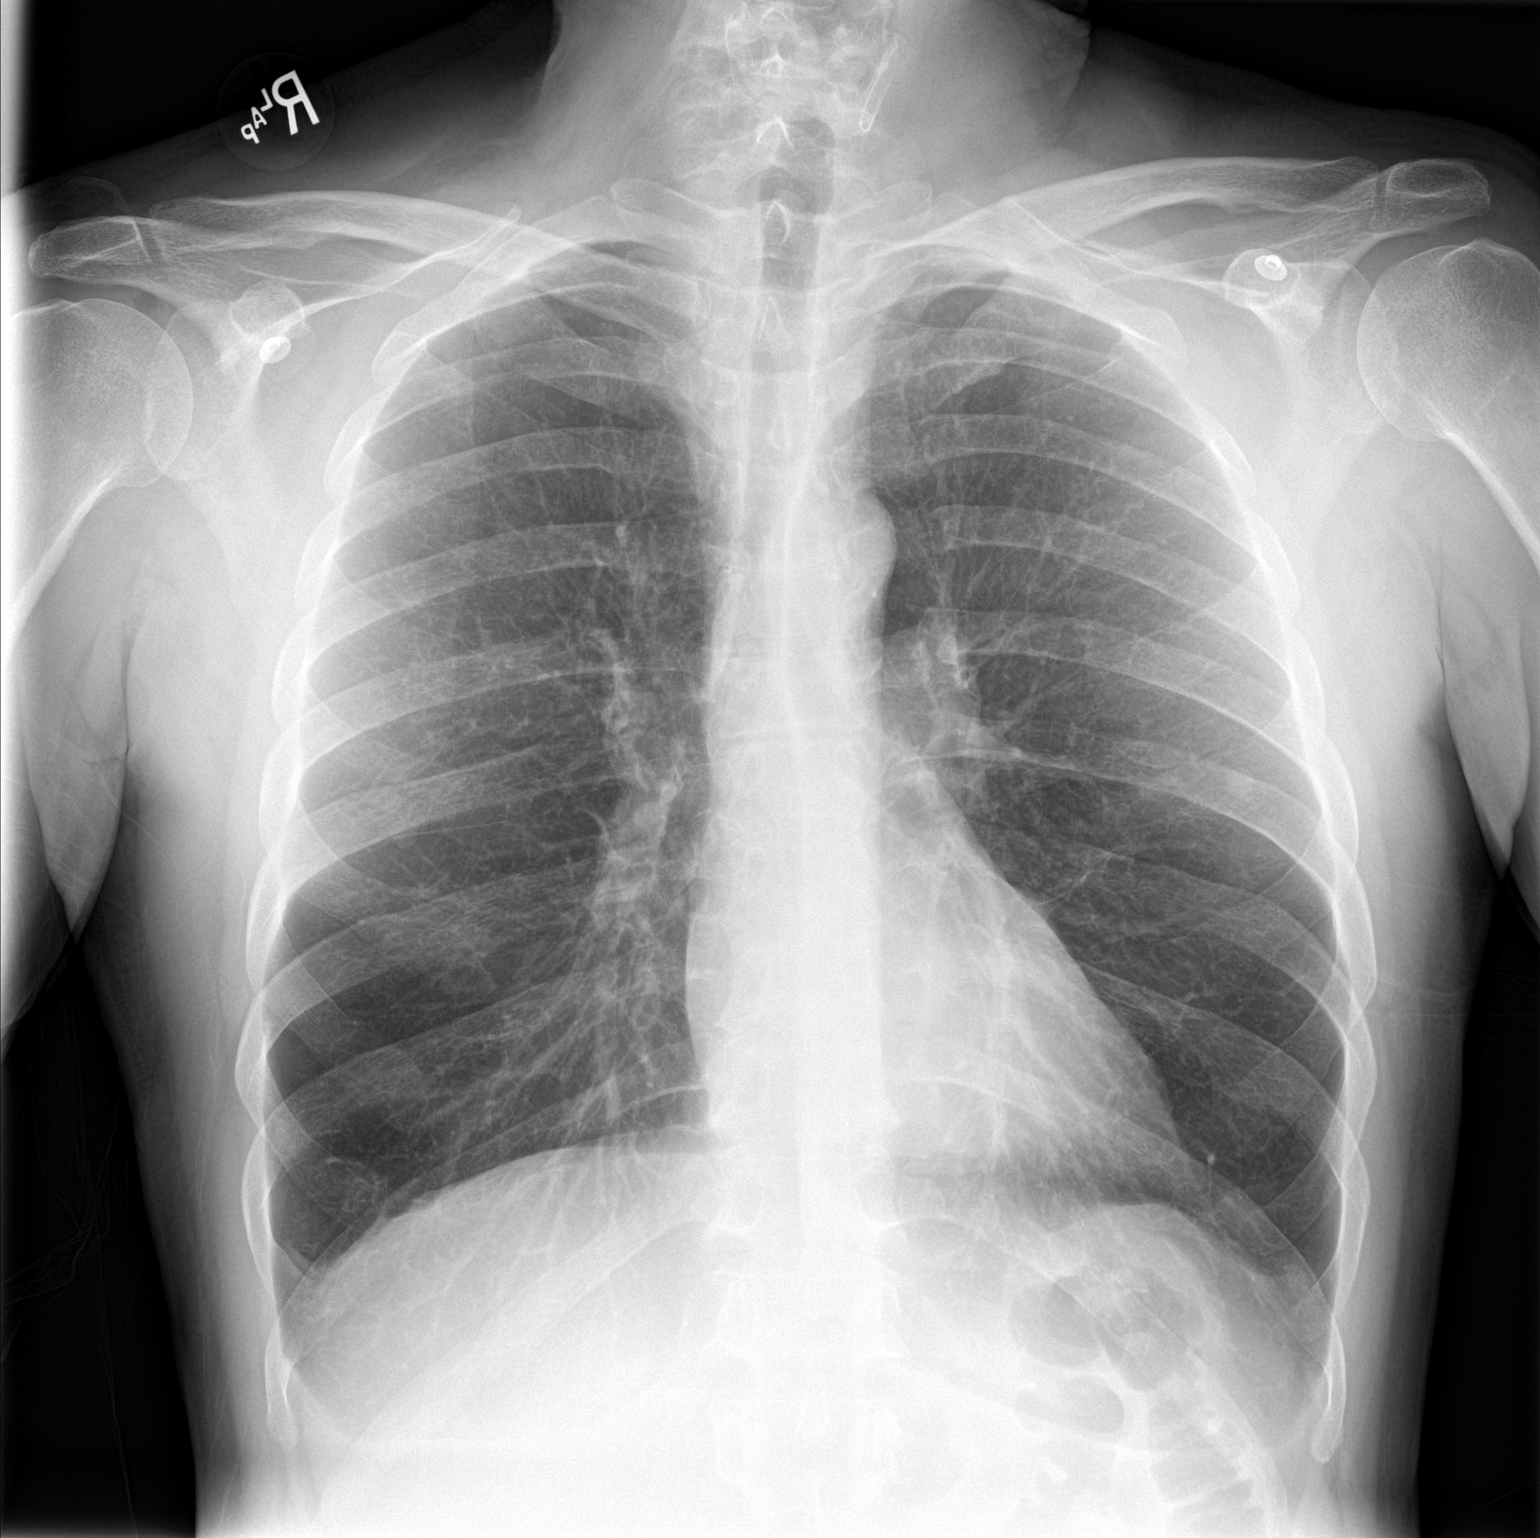

[chest lat]
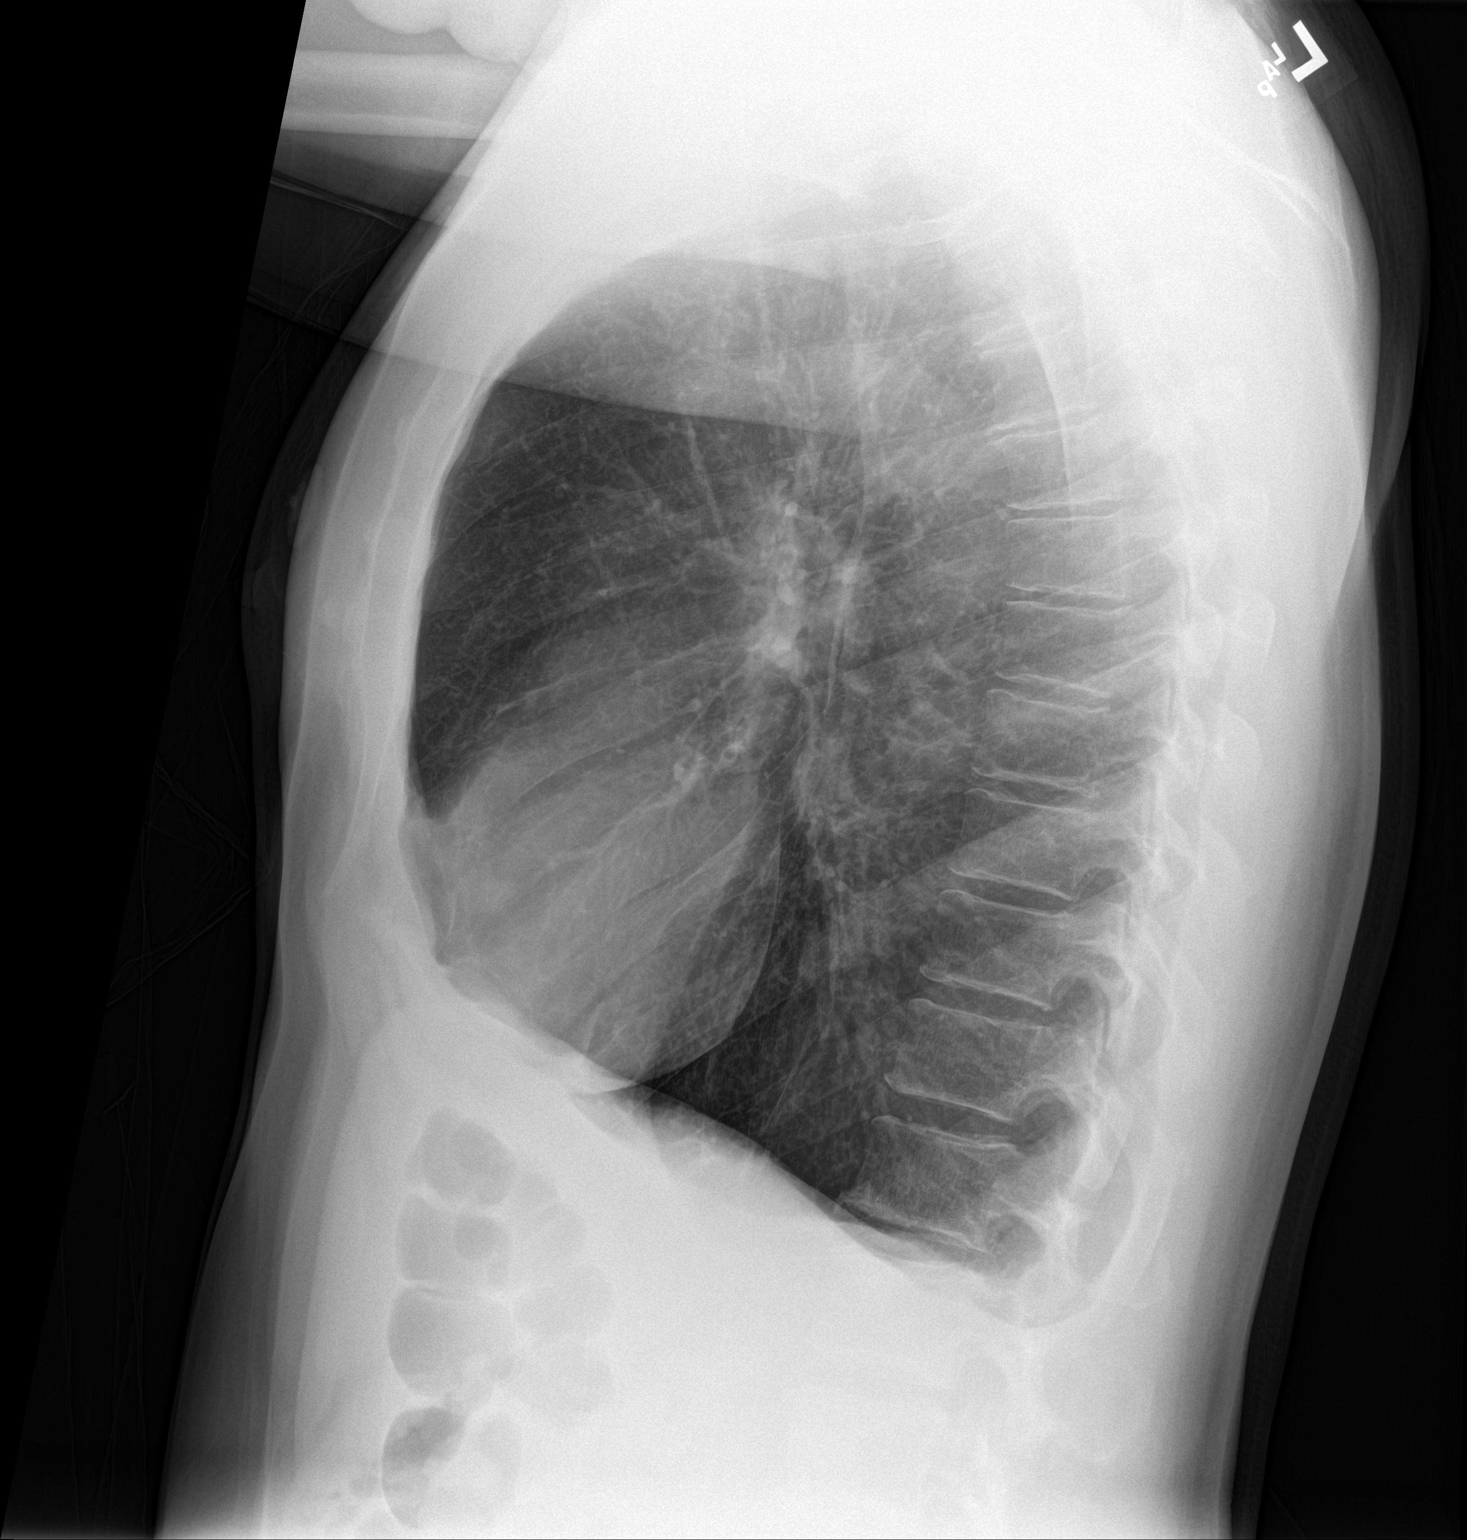

[2 of 2 positions shown; findings below may reference images not displayed]

FINDINGS: Tapering of the peripheral pulmonary vasculature and large lung
volumes favor emphysema. Bilaterally symmetric single nodular
densities projecting over the mid chest compatible with small nipple
shadows.

Cardiac and mediastinal margins appear normal. The lungs appear
otherwise clear. Large lung volumes with flattening of the
diaphragms on the lateral projection.
IMPRESSION: 1. Emphysema.  No acute radiographic findings.

## 2018-04-02 ENCOUNTER — Emergency Department
Admission: EM | Admit: 2018-04-02 | Discharge: 2018-04-02 | Discharge status: Against Medical Advice | Payer: BLUE CROSS/BLUE SHIELD | Attending: Emergency Medicine | Admitting: Emergency Medicine

## 2018-04-02 ENCOUNTER — Other Ambulatory Visit: Payer: Self-pay

## 2018-04-02 ENCOUNTER — Emergency Department: Payer: BLUE CROSS/BLUE SHIELD

## 2018-04-02 ENCOUNTER — Encounter: Payer: Self-pay | Admitting: Emergency Medicine

## 2018-04-02 DIAGNOSIS — Z5321 Procedure and treatment not carried out due to patient leaving prior to being seen by health care provider: Secondary | ICD-10-CM | POA: Diagnosis not present

## 2018-04-02 DIAGNOSIS — R079 Chest pain, unspecified: Secondary | ICD-10-CM | POA: Insufficient documentation

## 2018-04-02 LAB — CBC
HCT: 46.8 % (ref 39.0–52.0)
Hemoglobin: 15.6 g/dL (ref 13.0–17.0)
MCH: 28.9 pg (ref 26.0–34.0)
MCHC: 33.3 g/dL (ref 30.0–36.0)
MCV: 86.7 fL (ref 80.0–100.0)
Platelets: 226 10*3/uL (ref 150–400)
RBC: 5.4 MIL/uL (ref 4.22–5.81)
RDW: 13.2 % (ref 11.5–15.5)
WBC: 10.5 10*3/uL (ref 4.0–10.5)
nRBC: 0 % (ref 0.0–0.2)

## 2018-04-02 LAB — BASIC METABOLIC PANEL
Anion gap: 6 (ref 5–15)
BUN: 11 mg/dL (ref 6–20)
CHLORIDE: 106 mmol/L (ref 98–111)
CO2: 27 mmol/L (ref 22–32)
Calcium: 8.8 mg/dL — ABNORMAL LOW (ref 8.9–10.3)
Creatinine, Ser: 0.89 mg/dL (ref 0.61–1.24)
GFR calc Af Amer: >60 mL/min (ref >60)
GFR calc non Af Amer: >60 mL/min (ref >60)
GLUCOSE: 108 mg/dL — AB (ref 70–99)
POTASSIUM: 3.7 mmol/L (ref 3.5–5.1)
Sodium: 139 mmol/L (ref 135–145)

## 2018-04-02 LAB — TROPONIN I: Troponin I: 0.03 ng/mL (ref <0.03)

## 2018-04-02 NOTE — ED Notes (Signed)
Officer on duty says pt came up to her to retreive his pocket knife and said he wasn't waiting any longer; did not speak to any other staff; will see if he's going to put knife in car and return.Marland KitchenMarland KitchenMarland Kitchen

## 2018-04-02 NOTE — ED Triage Notes (Signed)
Chest pain since last night.   

## 2018-04-04 ENCOUNTER — Other Ambulatory Visit: Payer: Self-pay

## 2018-04-04 ENCOUNTER — Emergency Department: Payer: BLUE CROSS/BLUE SHIELD

## 2018-04-04 ENCOUNTER — Telehealth: Payer: Self-pay | Admitting: Emergency Medicine

## 2018-04-04 ENCOUNTER — Emergency Department
Admission: EM | Admit: 2018-04-04 | Discharge: 2018-04-04 | Disposition: A | Discharge status: Home / Self Care | Payer: BLUE CROSS/BLUE SHIELD | Attending: Emergency Medicine | Admitting: Emergency Medicine

## 2018-04-04 DIAGNOSIS — R079 Chest pain, unspecified: Secondary | ICD-10-CM | POA: Diagnosis present

## 2018-04-04 DIAGNOSIS — J40 Bronchitis, not specified as acute or chronic: Secondary | ICD-10-CM | POA: Diagnosis not present

## 2018-04-04 DIAGNOSIS — Z79899 Other long term (current) drug therapy: Secondary | ICD-10-CM | POA: Diagnosis not present

## 2018-04-04 DIAGNOSIS — J449 Chronic obstructive pulmonary disease, unspecified: Secondary | ICD-10-CM | POA: Diagnosis not present

## 2018-04-04 DIAGNOSIS — G43909 Migraine, unspecified, not intractable, without status migrainosus: Secondary | ICD-10-CM | POA: Insufficient documentation

## 2018-04-04 DIAGNOSIS — Z87891 Personal history of nicotine dependence: Secondary | ICD-10-CM | POA: Diagnosis not present

## 2018-04-04 LAB — CBC
HCT: 45.8 % (ref 39.0–52.0)
Hemoglobin: 15.1 g/dL (ref 13.0–17.0)
MCH: 29.4 pg (ref 26.0–34.0)
MCHC: 33 g/dL (ref 30.0–36.0)
MCV: 89.3 fL (ref 80.0–100.0)
PLATELETS: 216 10*3/uL (ref 150–400)
RBC: 5.13 MIL/uL (ref 4.22–5.81)
RDW: 13.2 % (ref 11.5–15.5)
WBC: 12.6 10*3/uL — ABNORMAL HIGH (ref 4.0–10.5)
nRBC: 0 % (ref 0.0–0.2)

## 2018-04-04 LAB — BASIC METABOLIC PANEL
Anion gap: 5 (ref 5–15)
BUN: 10 mg/dL (ref 6–20)
CALCIUM: 8.8 mg/dL — AB (ref 8.9–10.3)
CO2: 27 mmol/L (ref 22–32)
Chloride: 105 mmol/L (ref 98–111)
Creatinine, Ser: 0.81 mg/dL (ref 0.61–1.24)
GFR calc Af Amer: >60 mL/min (ref >60)
GFR calc non Af Amer: >60 mL/min (ref >60)
Glucose, Bld: 100 mg/dL — ABNORMAL HIGH (ref 70–99)
Potassium: 3.4 mmol/L — ABNORMAL LOW (ref 3.5–5.1)
Sodium: 137 mmol/L (ref 135–145)

## 2018-04-04 LAB — TROPONIN I: Troponin I: <0.03 ng/mL (ref <0.03)

## 2018-04-04 MED ORDER — SODIUM CHLORIDE 0.9% FLUSH
3.0000 mL | Freq: Once | INTRAVENOUS | Status: AC
  Administered 2018-04-04: 3 mL via INTRAVENOUS

## 2018-04-04 MED ORDER — KETOROLAC TROMETHAMINE 30 MG/ML IJ SOLN
15.0000 mg | Freq: Once | INTRAMUSCULAR | Status: AC
  Administered 2018-04-04: 15 mg via INTRAVENOUS
  Filled 2018-04-04: qty 1

## 2018-04-04 MED ORDER — SODIUM CHLORIDE 0.9 % IV BOLUS: 500.0000 mL | Freq: Once | INTRAVENOUS | Status: DC

## 2018-04-04 MED ORDER — IPRATROPIUM-ALBUTEROL 0.5-2.5 (3) MG/3ML IN SOLN
3.0000 mL | Freq: Once | RESPIRATORY_TRACT | Status: AC
  Administered 2018-04-04: 3 mL via RESPIRATORY_TRACT
  Filled 2018-04-04: qty 3

## 2018-04-04 MED ORDER — ALBUTEROL SULFATE HFA 108 (90 BASE) MCG/ACT IN AERS: 2.0000 | INHALATION_SPRAY | Freq: Four times a day (QID) | RESPIRATORY_TRACT | 2 refills | Status: AC | PRN

## 2018-04-04 MED ORDER — METHYLPREDNISOLONE SODIUM SUCC 125 MG IJ SOLR
125.0000 mg | Freq: Once | INTRAMUSCULAR | Status: AC
  Administered 2018-04-04: 125 mg via INTRAVENOUS
  Filled 2018-04-04: qty 2

## 2018-04-04 MED ORDER — METOCLOPRAMIDE HCL 5 MG/ML IJ SOLN
10.0000 mg | Freq: Once | INTRAMUSCULAR | Status: AC
  Administered 2018-04-04: 10 mg via INTRAVENOUS
  Filled 2018-04-04: qty 2

## 2018-04-04 MED ORDER — PREDNISONE 20 MG PO TABS: 60.0000 mg | ORAL_TABLET | Freq: Every day | ORAL | 0 refills | Status: AC

## 2018-04-04 NOTE — ED Provider Notes (Signed)
[facility] Emergency Department Provider Note  ____________________________________________  Time seen: Approximately 7:09 PM  I have reviewed the triage vital signs and the nursing notes.   HISTORY  Chief Complaint Headache and Numbness   HPI Bruce Harding is a 58 y.o. male with a history of COPD, smoking, sleep apnea not using CPAP, TIA who presents for evaluation of chest pain.  Patient was actually called in from home to return to the emergency room.  He was here 2 days ago complaining of chest pain and had a troponin of 0.03.  He left before seeing a doctor.  When the nurse called him at home to let him know about his results he continued to express chest pain and he was told to come back to the emergency room.  Patient is complaining of tightness of his chest that has been constant for the last 3 to 4 days associated with worsening productive cough and shortness of breath.  Patient denies any shortness of breath at rest but has moderate shortness of breath with exertion that started today.  No hemoptysis.  He has been unable to use his inhaler because he misplaced it.  No personal or family history of blood clots, recent travel immobilization, leg pain or swelling, or hemoptysis.  No personal family history of heart attacks.  Patient denies fever, chills, vomiting, diarrhea.  Patient is also complaining of a right frontal headache that started last night.  The pain was mild and he got progressively worse.  He reports a history of migraine headaches and this headache is identical to his migraines.  He is also complaining of numbness in his right hand and fingers which has been present for the last day.  Patient has a history of carpal tunnel syndrome with surgeries in the past.  He reports that this feels like his carpal tunnel syndrome.  He denies any numbness or weakness of the right arm or right leg, no facial droop.  Past Medical History:  Diagnosis Date  .  Allergic rhinitis   . Anxiety   . Arthritis   . Asthma   . Chronic back pain   . COPD (chronic obstructive pulmonary disease) (HCC)   . Depression   . Dyspnea   . Migraine headache    none since 2016  . Right leg numbness    occasional, S/P back surgery (per pt)  . Sleep apnea    no CPAP, since 2017  . TIA (transient ischemic attack) 2015   no deficits    There are no active problems to display for this patient.   Past Surgical History:  Procedure Laterality Date  . CARPAL TUNNEL RELEASE    . CARPAL TUNNEL RELEASE Right 10/13/2017   Procedure: CARPAL TUNNEL RELEASE;  Surgeon: Christena FlakePoggi, John J, MD;  [facility];  Service: Orthopedics;  Laterality: Right;  . GANGLION CYST EXCISION Right 10/13/2017   Procedure: REMOVAL GANGLION OF WRIST;  Surgeon: Christena FlakePoggi, John J, MD;  [facility];  Service: Orthopedics;  Laterality: Right;  . KNEE SURGERY    . LAMINECTOMY      Prior to Admission medications   Medication Sig Start Date End Date Taking? Authorizing Provider  albuterol (PROVENTIL HFA;VENTOLIN HFA) 108 (90 Base) MCG/ACT inhaler Inhale 2 puffs into the lungs every 6 (six) hours as needed for wheezing or shortness of breath. 04/04/18   Don PerkingVeronese, [place], MD  amLODipine (NORVASC) 2.5 MG tablet Take 1 tablet by mouth daily. 06/26/15  [provider]  DULoxetine (CYMBALTA) 30 MG capsule Take 1 capsule by mouth 2 (two) times daily. 06/26/15   [provider]  Fluticasone-Umeclidin-Vilant (TRELEGY ELLIPTA) 100-62.5-25 MCG/INH AEPB Inhale into the lungs daily.    [provider]  gabapentin (NEURONTIN) 300 MG capsule Take 600 mg by mouth 3 (three) times daily.    [provider]  naproxen (NAPROSYN) 500 MG tablet Take 500 mg by mouth 2 (two) times daily as needed.    [provider]  oxyCODONE-acetaminophen (PERCOCET) 10-325 MG tablet Take 1 tablet by mouth every 6 (six) hours as needed. 10/13/17   Poggi, Excell Seltzer, MD    predniSONE (DELTASONE) 20 MG tablet Take 3 tablets (60 mg total) by mouth daily for 4 days. 04/04/18 04/08/18  Nita Sickle, MD    Allergies Amoxicillin  No family history on file.  Social History Social History   Tobacco Use  . Smoking status: Former Smoker    Packs/day: 1.00    Years: 40.00    Pack years: 40.00    Types: Cigarettes    Last attempt to quit: 10/2016    Years since quitting: 1.4  . Smokeless tobacco: Never Used  Substance Use Topics  . Alcohol use: No  . Drug use: No    Review of Systems  Constitutional: Negative for fever. Eyes: Negative for visual changes. ENT: Negative for sore throat. Neck: No neck pain  Cardiovascular: Negative for chest pain. + chest tightness Respiratory: + shortness of breath, cough Gastrointestinal: Negative for abdominal pain, vomiting or diarrhea. Genitourinary: Negative for dysuria. Musculoskeletal: Negative for back pain. Skin: Negative for rash. Neurological: Negative for weakness or numbness. + HA, R hand numbness Psych: No SI or HI  ____________________________________________   PHYSICAL EXAM:  VITAL SIGNS: ED Triage Vitals  Enc Vitals Group     BP 04/04/18 1705 (!) 165/86     Pulse Rate 04/04/18 1705 88     Resp 04/04/18 1705 18     Temp 04/04/18 1705 97.8 F (36.6 C)     Temp Source 04/04/18 1907 Oral     SpO2 04/04/18 1705 94 %     Weight 04/04/18 1706 174 lb (78.9 kg)     Height 04/04/18 1706 5\' 5"  (1.651 m)     Head Circumference --      Peak Flow --      Pain Score 04/04/18 1706 8     Pain Loc --      Pain Edu? --      Excl. in GC? --     Constitutional: Alert and oriented. Well appearing and in no apparent distress. HEENT:      Head: Normocephalic and atraumatic.         Eyes: Conjunctivae are normal. Sclera is non-icteric.       Mouth/Throat: Mucous membranes are moist.       Neck: Supple with no signs of meningismus. Cardiovascular: Regular rate and rhythm. No murmurs, gallops, or  rubs. 2+ symmetrical distal pulses are present in all extremities. No JVD. Respiratory: Normal work of breathing, normal sats, severely diminished air movement bilaterally with very tight expiratory wheezes Gastrointestinal: Soft, non tender, and non distended with positive bowel sounds. No rebound or guarding. Musculoskeletal: Nontender with normal range of motion in all extremities. No edema, cyanosis, or erythema of extremities. Neurologic: Normal speech and language. Face is symmetric. Moving all extremities. No gross focal neurologic deficits are appreciated. Skin: Skin is warm, dry and intact. No rash noted.  Psychiatric: Mood and affect are normal. Speech and behavior are normal.  ____________________________________________   LABS (all labs ordered are listed, but only abnormal results are displayed)  Labs Reviewed  BASIC METABOLIC PANEL - Abnormal; Notable for the following components:      Result Value   Potassium 3.4 (*)    Glucose, Bld 100 (*)    Calcium 8.8 (*)    All other components within normal limits  CBC - Abnormal; Notable for the following components:   WBC 12.6 (*)    All other components within normal limits  TROPONIN I   ____________________________________________  EKG  ED ECG REPORT I, Nita Sickle, the attending physician, personally viewed and interpreted this ECG.  Normal sinus rhythm, rate of 87, normal intervals, normal axis, no ST elevations or depressions, anterior Q waves.  Unchanged from prior. ____________________________________________  RADIOLOGY  I have personally reviewed the images performed during this visit and I agree with the Radiologist's read.   Interpretation by Radiologist:  Dg Chest 2 View  Result Date: 04/04/2018 CLINICAL DATA:  Abnormal lab results.  Recent chest pain. EXAM: CHEST - 2 VIEW COMPARISON:  February 2020 FINDINGS: The heart, hila, mediastinum, lungs, and pleura are unchanged. Blunting of the costophrenic  angles is stable consistent with pleural thickening versus chronic tiny effusions. IMPRESSION: No acute interval change. Electronically Signed   By: Gerome Sam III M.D   On: 04/04/2018 17:39     ____________________________________________   PROCEDURES  Procedure(s) performed: None Procedures Critical Care performed:  None ____________________________________________   INITIAL IMPRESSION / ASSESSMENT AND PLAN / ED COURSE   58 y.o. male with a history of COPD, smoking, sleep apnea not using CPAP, TIA who presents for evaluation of chest tightness, productive cough, and shortness of breath.  Presentation consistent with bronchitis versus COPD exacerbation.  Low suspicion for PE with no hypoxia, tachycardia, tachypnea, hemoptysis or any other risk factors.  Chest x-ray with no evidence of pulmonary edema, pneumothorax or pneumonia.  Will treat with duo nebs and Solu-Medrol  #Headache: Patient with a history of migraine headaches.  Reports that this is identical to prior migraine headaches.  No thunderclap headache, neurologically intact otherwise.  Will treat with a migraine cocktail.  #Hand numbness: Patient with history of carpal tunnel syndrome.  No signs of stroke.  Patient has strong radial pulse with brisk capillary refill, hand is warm and well-perfused.  Patient will be put on steroids for his COPD exacerbation.  Recommended follow-up with his surgeon if symptoms do improve on steroids.  Clinical Course as of Apr 04 2026  Mon Apr 04, 2018  2027 Headache resolved with migraine cocktail.  Patient remains well-appearing neurologically intact.  He reports resolution of his shortness of breath, remains with normal work of breathing and no oxygen requirement.  Will discharge home on prednisone, albuterol, follow-up with primary care doctor.  Discussed return precautions.   [CV]    Clinical Course User Index [CV] Don Perking [place], MD     As part of my medical decision making, I  reviewed the following data within the electronic MEDICAL RECORD NUMBER Nursing notes reviewed and incorporated, Labs reviewed , EKG interpreted , Old EKG reviewed, Old chart reviewed, Radiograph reviewed , Notes from prior ED visits and [place] Controlled Substance Database    Pertinent labs & imaging results that were available during my care of the patient were reviewed by me and considered in my medical decision making (see chart for details).    ____________________________________________  FINAL CLINICAL IMPRESSION(S) / ED DIAGNOSES  Final diagnoses:  Bronchitis  Migraine without status migrainosus, not intractable, unspecified migraine type      NEW MEDICATIONS STARTED DURING THIS VISIT:  ED Discharge Orders         Ordered    predniSONE (DELTASONE) 20 MG tablet  Daily     04/04/18 2028    albuterol (PROVENTIL HFA;VENTOLIN HFA) 108 (90 Base) MCG/ACT inhaler  Every 6 hours PRN     04/04/18 2028           Note:  This document was prepared using Dragon voice recognition software and may include unintentional dictation errors.    Nita Sickle, MD 04/04/18 2029

## 2018-04-04 NOTE — ED Triage Notes (Addendum)
Pt comes via POV from home after receiving call that he needed to come back in because of his heart blood work that was completed. Pt states when he was here he ended up leaving before seeing a MD.  Pt states he was seen here on Saturday night with chest pain.  Pt states chest pain, numbness and pounding headache.  Pt states central chest pain across.

## 2018-04-04 NOTE — Telephone Encounter (Signed)
Called patient due to lwot to inquire about condition and follow up plans. Explained troponin result to both patient and son.  Patient is speaking of a lot of stress with family. Saying his daughter wanted him to die anyway.  He says he currently has chest pain.  I advised him to return.  He was talking about having to wait so long to be seen, and I explained that it is his heart and it is important to wait to be seen.   He says he will think about it.

## 2018-04-04 NOTE — Discharge Instructions (Addendum)

## 2018-04-04 NOTE — ED Notes (Signed)
Signature pad not working, pt signed paper copy, forwarded to chart.

## 2018-08-16 DIAGNOSIS — H2513 Age-related nuclear cataract, bilateral: Secondary | ICD-10-CM | POA: Diagnosis not present

## 2018-08-16 DIAGNOSIS — H5213 Myopia, bilateral: Secondary | ICD-10-CM | POA: Diagnosis not present

## 2018-08-31 DIAGNOSIS — H524 Presbyopia: Secondary | ICD-10-CM | POA: Diagnosis not present

## 2019-04-14 DIAGNOSIS — H2513 Age-related nuclear cataract, bilateral: Secondary | ICD-10-CM | POA: Diagnosis not present

## 2019-05-08 DIAGNOSIS — R05 Cough: Secondary | ICD-10-CM | POA: Diagnosis not present

## 2019-05-08 DIAGNOSIS — R0602 Shortness of breath: Secondary | ICD-10-CM | POA: Diagnosis not present

## 2019-05-08 DIAGNOSIS — R0781 Pleurodynia: Secondary | ICD-10-CM | POA: Diagnosis not present

## 2019-05-08 DIAGNOSIS — R0789 Other chest pain: Secondary | ICD-10-CM | POA: Diagnosis not present

## 2019-05-08 DIAGNOSIS — R071 Chest pain on breathing: Secondary | ICD-10-CM | POA: Diagnosis not present

## 2019-05-08 DIAGNOSIS — R079 Chest pain, unspecified: Secondary | ICD-10-CM | POA: Diagnosis not present

## 2019-05-19 DIAGNOSIS — I10 Essential (primary) hypertension: Secondary | ICD-10-CM | POA: Diagnosis not present

## 2019-05-19 DIAGNOSIS — Z720 Tobacco use: Secondary | ICD-10-CM | POA: Diagnosis not present

## 2019-05-19 DIAGNOSIS — G894 Chronic pain syndrome: Secondary | ICD-10-CM | POA: Diagnosis not present

## 2019-05-19 DIAGNOSIS — J449 Chronic obstructive pulmonary disease, unspecified: Secondary | ICD-10-CM | POA: Diagnosis not present

## 2019-05-22 DIAGNOSIS — R0600 Dyspnea, unspecified: Secondary | ICD-10-CM | POA: Diagnosis not present

## 2019-05-22 DIAGNOSIS — F17218 Nicotine dependence, cigarettes, with other nicotine-induced disorders: Secondary | ICD-10-CM | POA: Diagnosis not present

## 2019-05-22 DIAGNOSIS — J439 Emphysema, unspecified: Secondary | ICD-10-CM | POA: Diagnosis not present

## 2019-08-22 DIAGNOSIS — R634 Abnormal weight loss: Secondary | ICD-10-CM | POA: Diagnosis not present

## 2020-06-21 DIAGNOSIS — I10 Essential (primary) hypertension: Secondary | ICD-10-CM | POA: Diagnosis not present

## 2020-06-21 DIAGNOSIS — G894 Chronic pain syndrome: Secondary | ICD-10-CM | POA: Diagnosis not present

## 2020-06-21 DIAGNOSIS — J449 Chronic obstructive pulmonary disease, unspecified: Secondary | ICD-10-CM | POA: Diagnosis not present

## 2020-06-25 ENCOUNTER — Other Ambulatory Visit: Payer: Self-pay | Admitting: *Deleted

## 2020-06-25 NOTE — Patient Outreach (Signed)
Care Coordination  06/25/2020  FREELAND PRACHT 03-20-1960 [id number]   Medicaid Managed Care   Unsuccessful Outreach Note  06/25/2020 Name: Bruce Harding MRN: [id number] DOB: Aug 28, 1960  Referred by: Mickel Fuchs, MD Reason for referral : High Risk Managed Medicaid (Unsuccessful RNCM initial outreach)   An unsuccessful telephone outreach was attempted today. The patient was referred to the case management team for assistance with care management and care coordination.   Follow Up Plan: A HIPAA compliant phone message was left for the patient providing contact information and requesting a return call.  The care management team will reach out to the patient again over the next 7-14 days.   Estanislado Emms RN, BSN [place]  [employer]

## 2020-06-25 NOTE — Patient Instructions (Signed)
Visit Information  Mr. ISSACHAR BROADY  - as a part of your Medicaid benefit, you are eligible for care management and care coordination services at no cost or copay. I was unable to reach you by phone today but would be happy to help you with your health related needs. Please feel free to call me @ [phone].   A member of the Managed Medicaid care management team will reach out to you again over the next 7-14 days.   Estanislado Emms RN, BSN [place]  [employer]

## 2020-08-01 DIAGNOSIS — M545 Low back pain, unspecified: Secondary | ICD-10-CM | POA: Diagnosis not present

## 2020-08-01 DIAGNOSIS — R1011 Right upper quadrant pain: Secondary | ICD-10-CM | POA: Diagnosis not present

## 2020-08-01 DIAGNOSIS — R0789 Other chest pain: Secondary | ICD-10-CM | POA: Diagnosis not present

## 2020-08-01 DIAGNOSIS — R0602 Shortness of breath: Secondary | ICD-10-CM | POA: Diagnosis not present

## 2020-08-01 DIAGNOSIS — F17200 Nicotine dependence, unspecified, uncomplicated: Secondary | ICD-10-CM | POA: Diagnosis present

## 2020-08-01 DIAGNOSIS — R55 Syncope and collapse: Secondary | ICD-10-CM | POA: Diagnosis not present

## 2020-08-01 DIAGNOSIS — Z720 Tobacco use: Secondary | ICD-10-CM | POA: Diagnosis not present

## 2020-08-01 DIAGNOSIS — J449 Chronic obstructive pulmonary disease, unspecified: Secondary | ICD-10-CM | POA: Diagnosis not present

## 2020-08-01 DIAGNOSIS — G8929 Other chronic pain: Secondary | ICD-10-CM | POA: Diagnosis not present

## 2020-08-01 DIAGNOSIS — R42 Dizziness and giddiness: Secondary | ICD-10-CM | POA: Diagnosis not present

## 2020-08-01 DIAGNOSIS — I10 Essential (primary) hypertension: Secondary | ICD-10-CM | POA: Diagnosis present

## 2020-08-01 DIAGNOSIS — R079 Chest pain, unspecified: Secondary | ICD-10-CM | POA: Diagnosis not present

## 2020-08-01 DIAGNOSIS — I213 ST elevation (STEMI) myocardial infarction of unspecified site: Secondary | ICD-10-CM | POA: Diagnosis not present

## 2020-08-01 DIAGNOSIS — M549 Dorsalgia, unspecified: Secondary | ICD-10-CM | POA: Diagnosis not present

## 2020-08-02 DIAGNOSIS — J449 Chronic obstructive pulmonary disease, unspecified: Secondary | ICD-10-CM | POA: Diagnosis not present

## 2020-08-02 DIAGNOSIS — R55 Syncope and collapse: Secondary | ICD-10-CM | POA: Diagnosis not present

## 2020-08-02 DIAGNOSIS — R1011 Right upper quadrant pain: Secondary | ICD-10-CM | POA: Diagnosis not present

## 2020-08-02 DIAGNOSIS — M545 Low back pain, unspecified: Secondary | ICD-10-CM | POA: Diagnosis not present

## 2020-08-02 DIAGNOSIS — R079 Chest pain, unspecified: Secondary | ICD-10-CM | POA: Diagnosis not present

## 2020-08-02 DIAGNOSIS — G8929 Other chronic pain: Secondary | ICD-10-CM | POA: Diagnosis not present

## 2020-08-02 DIAGNOSIS — Z720 Tobacco use: Secondary | ICD-10-CM | POA: Diagnosis not present

## 2020-08-02 DIAGNOSIS — J441 Chronic obstructive pulmonary disease with (acute) exacerbation: Secondary | ICD-10-CM | POA: Diagnosis present

## 2020-08-03 DIAGNOSIS — Z720 Tobacco use: Secondary | ICD-10-CM | POA: Diagnosis not present

## 2020-08-03 DIAGNOSIS — G8929 Other chronic pain: Secondary | ICD-10-CM | POA: Diagnosis not present

## 2020-08-03 DIAGNOSIS — M545 Low back pain, unspecified: Secondary | ICD-10-CM | POA: Diagnosis not present

## 2020-08-03 DIAGNOSIS — J449 Chronic obstructive pulmonary disease, unspecified: Secondary | ICD-10-CM | POA: Diagnosis not present

## 2020-08-03 DIAGNOSIS — R079 Chest pain, unspecified: Secondary | ICD-10-CM | POA: Diagnosis not present

## 2020-08-03 DIAGNOSIS — R55 Syncope and collapse: Secondary | ICD-10-CM | POA: Diagnosis not present

## 2020-08-16 DIAGNOSIS — K0889 Other specified disorders of teeth and supporting structures: Secondary | ICD-10-CM | POA: Diagnosis not present

## 2020-11-03 IMAGING — CR DG CHEST 2V
1 series · 2 of 2 positions shown · non-contrast
Comparison: March 2018

CLINICAL DATA: Abnormal lab results.  Recent chest pain.

EXAM:
CHEST - 2 VIEW

[Series 1: dg chest 2 view · 0.14mm/px · 2 of 2 slices shown]
[im 1/2]
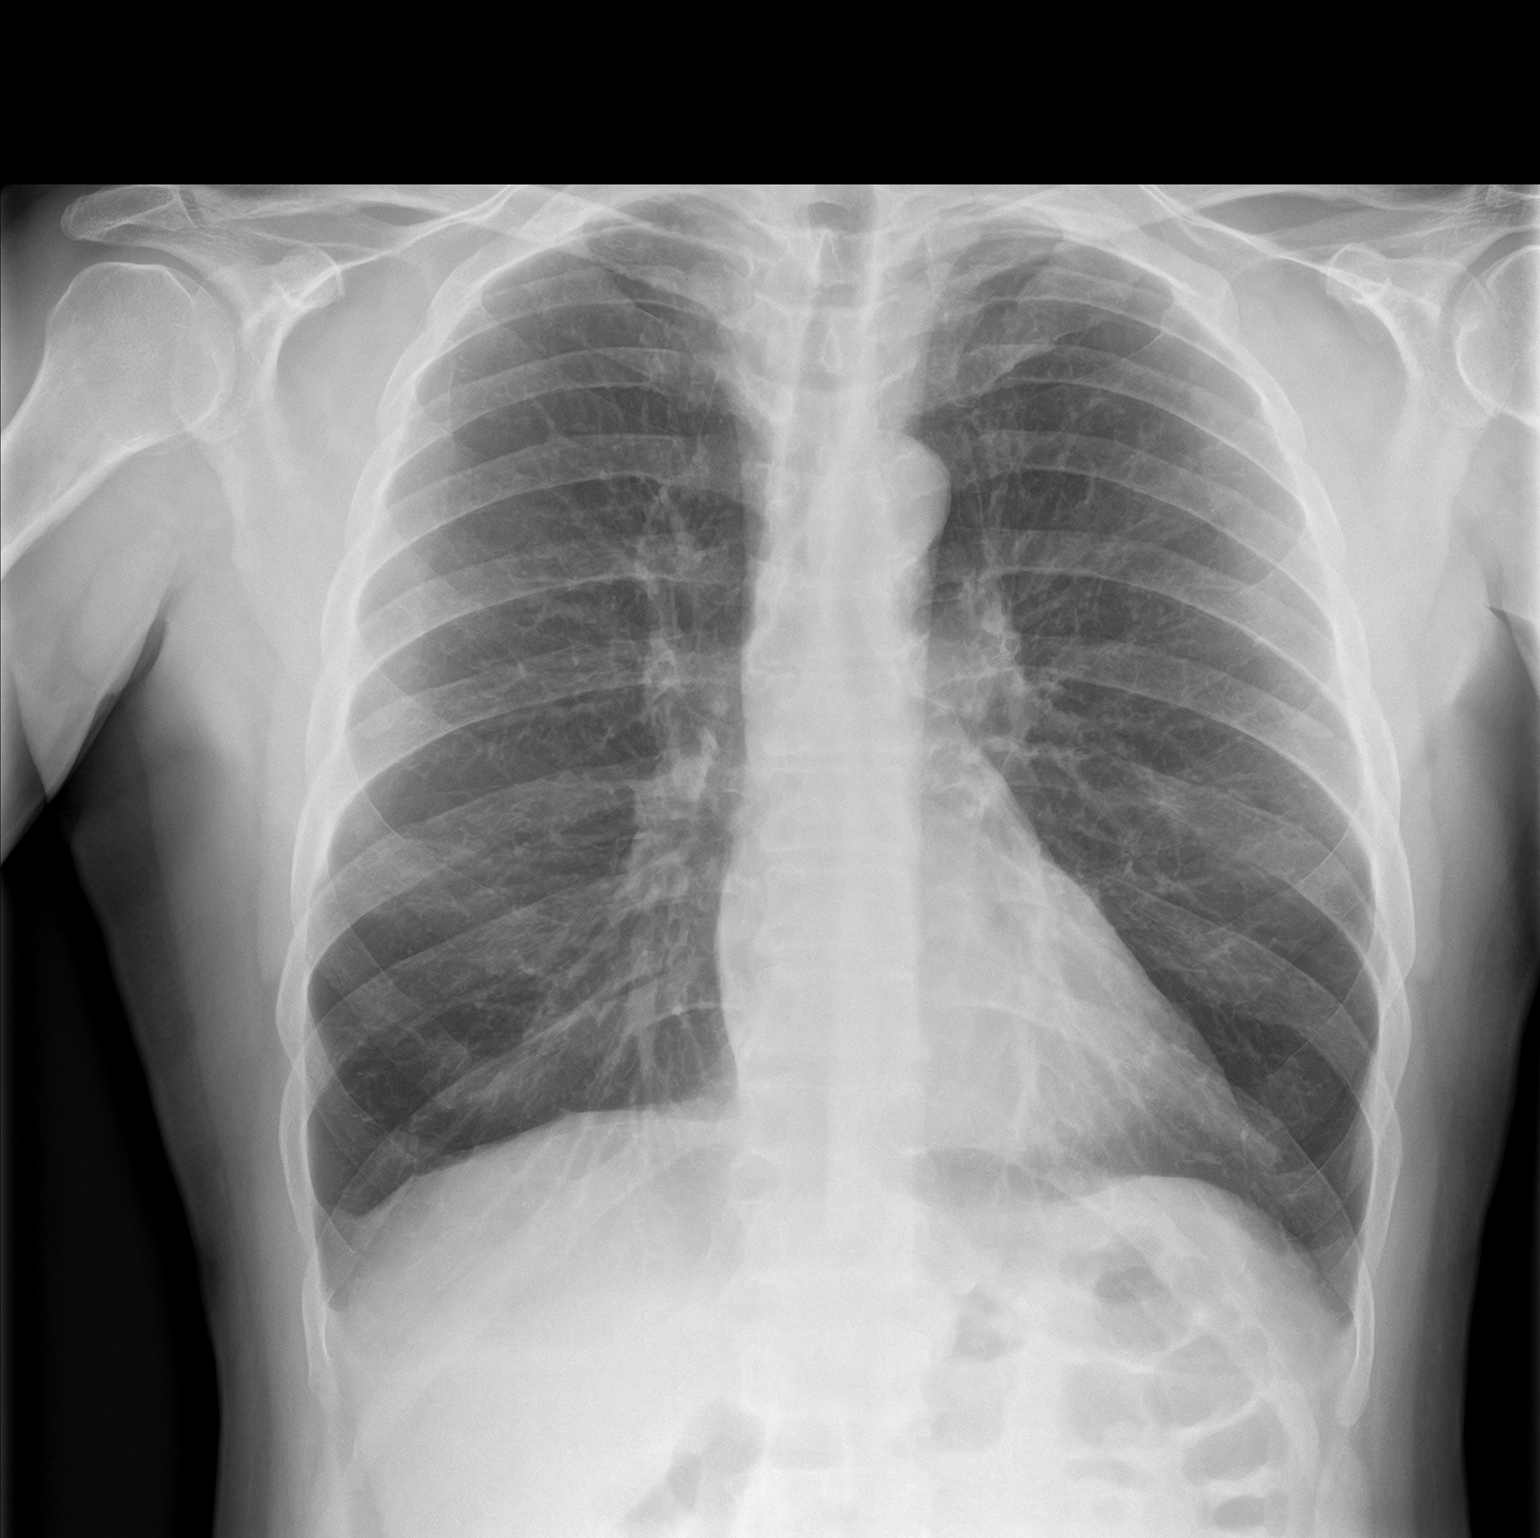
[im 2/2]
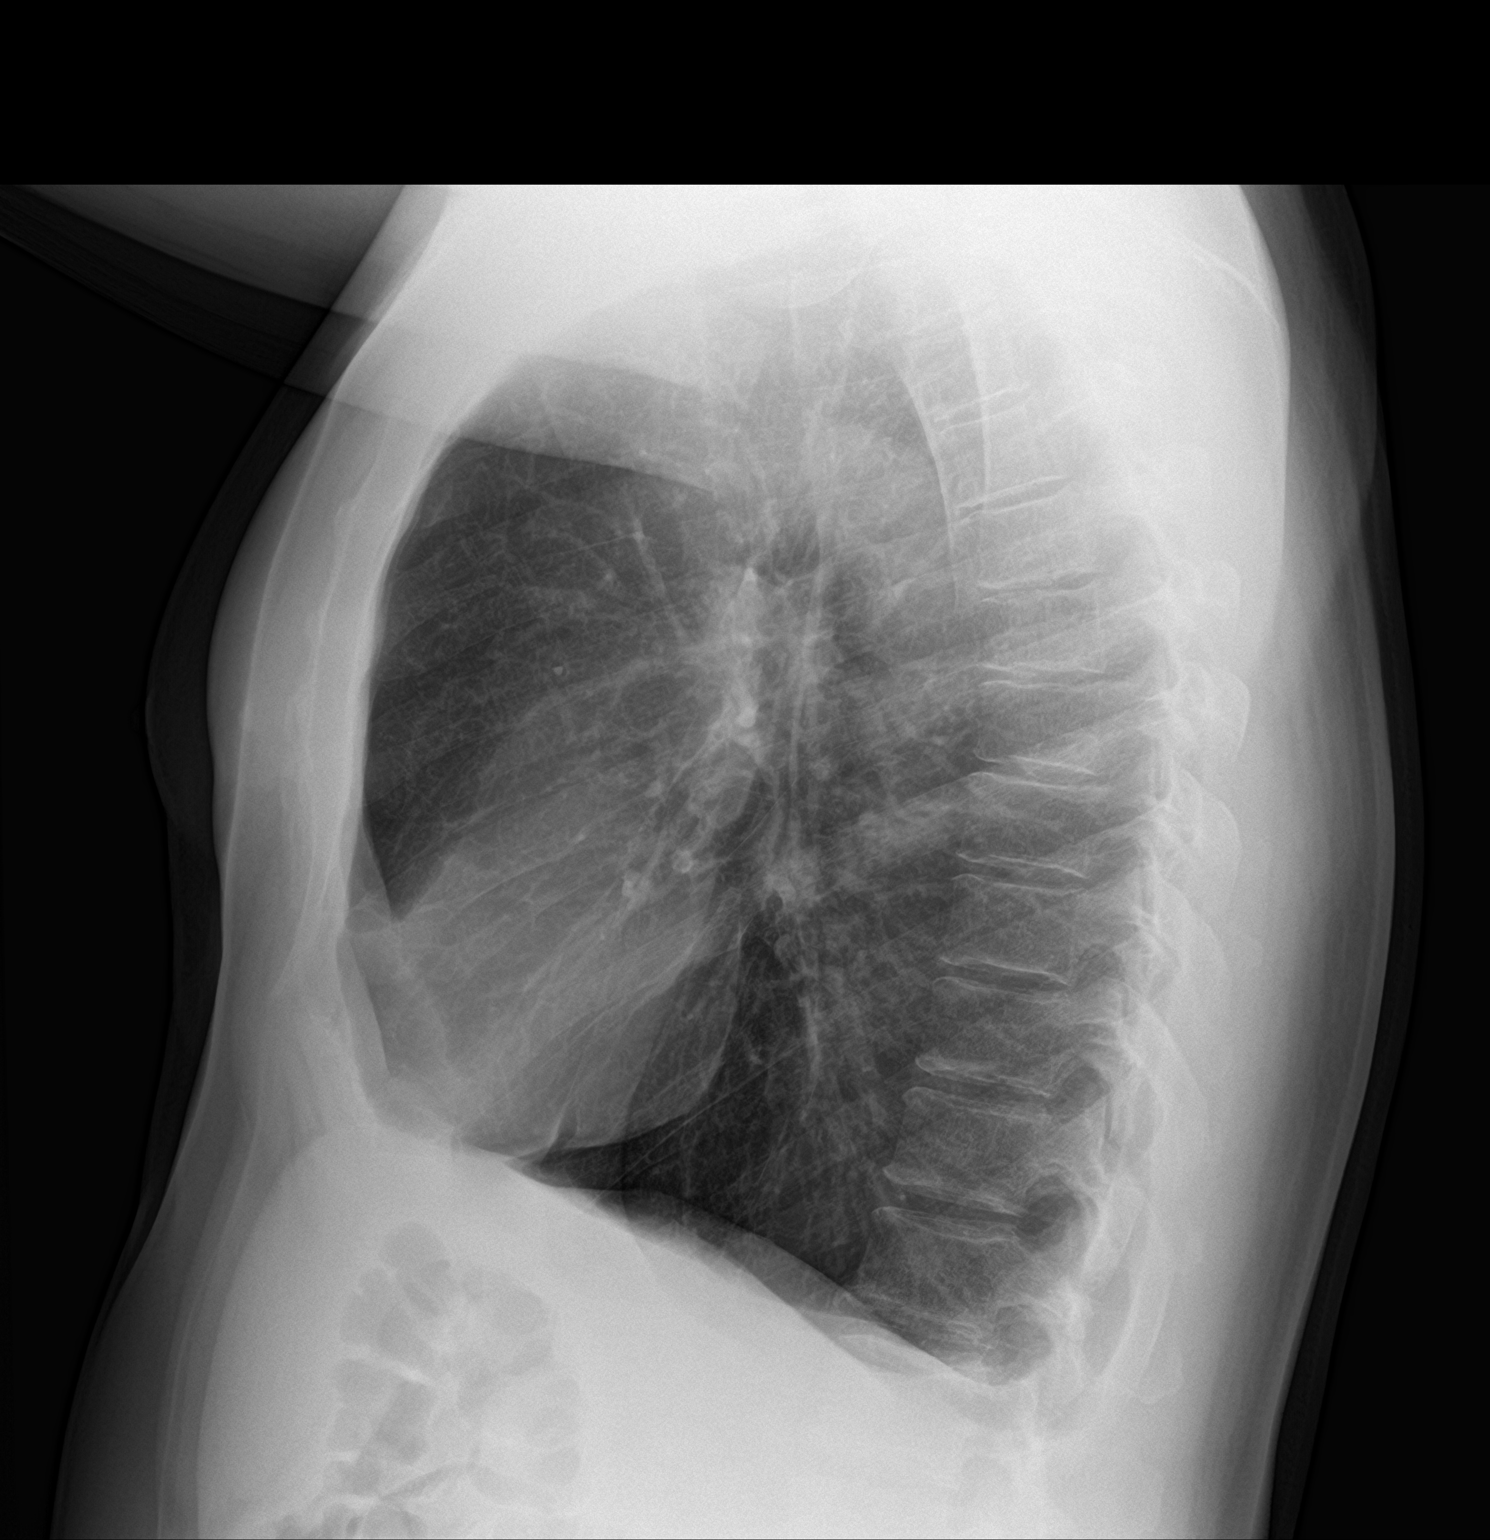

[2 of 2 positions shown; findings below may reference images not displayed]

FINDINGS: The heart, hila, mediastinum, lungs, and pleura are unchanged.
Blunting of the costophrenic angles is stable consistent with
pleural thickening versus chronic tiny effusions.
IMPRESSION: No acute interval change.

## 2021-01-15 DIAGNOSIS — J449 Chronic obstructive pulmonary disease, unspecified: Secondary | ICD-10-CM | POA: Diagnosis not present

## 2021-01-15 DIAGNOSIS — F3341 Major depressive disorder, recurrent, in partial remission: Secondary | ICD-10-CM | POA: Diagnosis not present

## 2021-01-15 DIAGNOSIS — G894 Chronic pain syndrome: Secondary | ICD-10-CM | POA: Diagnosis not present

## 2021-04-18 DIAGNOSIS — I10 Essential (primary) hypertension: Secondary | ICD-10-CM | POA: Diagnosis not present

## 2021-04-18 DIAGNOSIS — G894 Chronic pain syndrome: Secondary | ICD-10-CM | POA: Diagnosis not present

## 2021-04-18 DIAGNOSIS — Z0130 Encounter for examination of blood pressure without abnormal findings: Secondary | ICD-10-CM | POA: Diagnosis not present

## 2021-04-18 DIAGNOSIS — F3341 Major depressive disorder, recurrent, in partial remission: Secondary | ICD-10-CM | POA: Diagnosis not present

## 2021-04-18 DIAGNOSIS — Z0000 Encounter for general adult medical examination without abnormal findings: Secondary | ICD-10-CM | POA: Diagnosis not present

## 2021-04-18 DIAGNOSIS — J449 Chronic obstructive pulmonary disease, unspecified: Secondary | ICD-10-CM | POA: Diagnosis not present

## 2022-06-18 ENCOUNTER — Other Ambulatory Visit: Payer: Self-pay | Admitting: *Deleted

## 2022-06-18 DIAGNOSIS — Z122 Encounter for screening for malignant neoplasm of respiratory organs: Secondary | ICD-10-CM

## 2022-06-18 DIAGNOSIS — Z87891 Personal history of nicotine dependence: Secondary | ICD-10-CM

## 2022-06-18 DIAGNOSIS — F17210 Nicotine dependence, cigarettes, uncomplicated: Secondary | ICD-10-CM

## 2022-07-08 ENCOUNTER — Encounter: Payer: Self-pay | Admitting: Acute Care

## 2022-07-08 ENCOUNTER — Ambulatory Visit (INDEPENDENT_AMBULATORY_CARE_PROVIDER_SITE_OTHER): Payer: Medicaid Other | Admitting: Acute Care

## 2022-07-08 DIAGNOSIS — F17210 Nicotine dependence, cigarettes, uncomplicated: Secondary | ICD-10-CM | POA: Diagnosis not present

## 2022-07-08 NOTE — Progress Notes (Signed)
Virtual Visit via Telephone Note  I connected with Bruce Harding on 07/08/22 at  9:30 AM EDT by telephone and verified that I am speaking with the correct person using two identifiers.  Location: Patient: At home Provider: [address] [place], [place], [address]    I discussed the limitations, risks, security and privacy concerns of performing an evaluation and management service by telephone and the availability of in person appointments. I also discussed with the patient that there may be a patient responsible charge related to this service. The patient expressed understanding and agreed to proceed.   Bruce Ngo, NP  Shared Decision Making Visit Lung Cancer Screening Program [phone])   Eligibility: Age 62 y.o. Pack Years Smoking History Calculation 55 pack years (# packs/per year x # years smoked) Recent History of coughing up blood  no Unexplained weight loss? no ( >Than 15 pounds within the last 6 months )>> 20 pounds in the last year Prior History Lung / other cancer no (Diagnosis within the last 5 years already requiring surveillance chest CT Scans). Smoking Status Current Smoker Former Smokers: Years since quit:  NA  Quit Date:  NA  Visit Components: Discussion included one or more decision making aids. yes Discussion included risk/benefits of screening. yes Discussion included potential follow up diagnostic testing for abnormal scans. yes Discussion included meaning and risk of over diagnosis. yes Discussion included meaning and risk of False Positives. yes Discussion included meaning of total radiation exposure. yes  Counseling Included: Importance of adherence to annual lung cancer LDCT screening. yes Impact of comorbidities on ability to participate in the program. yes Ability and willingness to under diagnostic treatment. yes  Smoking Cessation Counseling: Current Smokers:  Discussed importance of smoking cessation. yes Information about tobacco  cessation classes and interventions provided to patient. yes Patient provided with "ticket" for LDCT Scan. yes Symptomatic Patient. no  Counseling NA Diagnosis Code: Tobacco Use Z72.0 Asymptomatic Patient yes  Counseling (Intermediate counseling: > three minutes counseling) U0454 Former Smokers:  Discussed the importance of maintaining cigarette abstinence. yes Diagnosis Code: Personal History of Nicotine Dependence. U98.119 Information about tobacco cessation classes and interventions provided to patient. Yes Patient provided with "ticket" for LDCT Scan. yes Written Order for Lung Cancer Screening with LDCT placed in Epic. Yes (CT Chest Lung Cancer Screening Low Dose W/O CM) [id number] Z12.2-Screening of respiratory organs Z87.891-Personal history of nicotine dependence  I have spent 25 minutes of face to face/ virtual visit   time with  Bruce Harding discussing the risks and benefits of lung cancer screening. We viewed / discussed a power point together that explained in detail the above noted topics. We paused at intervals to allow for questions to be asked and answered to ensure understanding.We discussed that the single most powerful action that he can take to decrease his risk of developing lung cancer is to quit smoking. We discussed whether or not he is ready to commit to setting a quit date. We discussed options for tools to aid in quitting smoking including nicotine replacement therapy, non-nicotine medications, support groups, Quit Smart classes, and behavior modification. We discussed that often times setting smaller, more achievable goals, such as eliminating 1 cigarette a day for a week and then 2 cigarettes a day for a week can be helpful in slowly decreasing the number of cigarettes smoked. This allows for a sense of accomplishment as well as providing a clinical benefit. I provided  him  with smoking cessation  information  with contact information for community resources, classes, free  nicotine replacement therapy, and access to mobile apps, text messaging, and on-line smoking cessation help. I have also provided  him  the office contact information in the event he needs to contact me, or the screening staff. We discussed the time and location of the scan, and that either Abigail Miyamoto RN, Karlton Lemon, RN  or I will call / send a letter with the results within 24-72 hours of receiving them. The patient verbalized understanding of all of  the above and had no further questions upon leaving the office. They have my contact information in the event they have any further questions.  I spent 3-4 minutes counseling on smoking cessation and the health risks of continued tobacco abuse.  I explained to the patient that there has been a high incidence of coronary artery disease noted on these exams. I explained that this is a non-gated exam therefore degree or severity cannot be determined. This patient is not on statin therapy. I have asked the patient to follow-up with their PCP regarding any incidental finding of coronary artery disease and management with diet or medication as their PCP  feels is clinically indicated. The patient verbalized understanding of the above and had no further questions upon completion of the visit.      Bruce Ngo, NP 07/08/2022

## 2022-07-08 NOTE — Patient Instructions (Signed)
Thank you for participating in the  Lung Cancer Screening Program. It was our pleasure to meet you today. We will call you with the results of your scan within the next few days. Your scan will be assigned a Lung RADS category score by the physicians reading the scans.  This Lung RADS score determines follow up scanning.  See below for description of categories, and follow up screening recommendations. We will be in touch to schedule your follow up screening annually or based on recommendations of our providers. We will fax a copy of your scan results to your Primary Care Physician, or the physician who referred you to the program, to ensure they have the results. Please call the office if you have any questions or concerns regarding your scanning experience or results.  Our office number is [phone]. Please speak with Denise Phelps, RN. , or  Denise Buckner RN, They are  our Lung Cancer Screening RN.'s If They are unavailable when you call, Please leave a message on the voice mail. We will return your call at our earliest convenience.This voice mail is monitored several times a day.  Remember, if your scan is normal, we will scan you annually as long as you continue to meet the criteria for the program. (Age 50-80, Current smoker or smoker who has quit within the last 15 years). If you are a smoker, remember, quitting is the single most powerful action that you can take to decrease your risk of lung cancer and other pulmonary, breathing related problems. We know quitting is hard, and we are here to help.  Please let us know if there is anything we can do to help you meet your goal of quitting. If you are a former smoker, congratulations. We are proud of you! Remain smoke free! Remember you can refer friends or family members through the number above.  We will screen them to make sure they meet criteria for the program. Thank you for helping us take better care of you by  participating in Lung Screening.  You can receive free nicotine replacement therapy ( patches, gum or mints) by calling 1-800-QUIT NOW. Please call so we can get you on the path to becoming  a non-smoker. I know it is hard, but you can do this!  Lung RADS Categories:  Lung RADS 1: no nodules or definitely non-concerning nodules.  Recommendation is for a repeat annual scan in 12 months.  Lung RADS 2:  nodules that are non-concerning in appearance and behavior with a very low likelihood of becoming an active cancer. Recommendation is for a repeat annual scan in 12 months.  Lung RADS 3: nodules that are probably non-concerning , includes nodules with a low likelihood of becoming an active cancer.  Recommendation is for a 6-month repeat screening scan. Often noted after an upper respiratory illness. We will be in touch to make sure you have no questions, and to schedule your 6-month scan.  Lung RADS 4 A: nodules with concerning findings, recommendation is most often for a follow up scan in 3 months or additional testing based on our provider's assessment of the scan. We will be in touch to make sure you have no questions and to schedule the recommended 3 month follow up scan.  Lung RADS 4 B:  indicates findings that are concerning. We will be in touch with you to schedule additional diagnostic testing based on our provider's  assessment of the scan.  Other options for assistance in smoking cessation (   As covered by your insurance benefits)  Hypnosis for smoking cessation  [facility] [phone]  Acupuncture for smoking cessation  [facility] [phone]   

## 2022-07-09 ENCOUNTER — Ambulatory Visit
Admission: RE | Admit: 2022-07-09 | Discharge: 2022-07-09 | Disposition: A | Discharge status: Home / Self Care | Payer: Medicaid Other | Source: Ambulatory Visit | Attending: Acute Care | Admitting: Acute Care

## 2022-07-09 DIAGNOSIS — Z122 Encounter for screening for malignant neoplasm of respiratory organs: Secondary | ICD-10-CM | POA: Insufficient documentation

## 2022-07-09 DIAGNOSIS — F17210 Nicotine dependence, cigarettes, uncomplicated: Secondary | ICD-10-CM | POA: Diagnosis present

## 2022-07-09 DIAGNOSIS — Z87891 Personal history of nicotine dependence: Secondary | ICD-10-CM | POA: Diagnosis not present

## 2022-07-13 ENCOUNTER — Other Ambulatory Visit: Payer: Self-pay | Admitting: Acute Care

## 2022-07-13 DIAGNOSIS — F17210 Nicotine dependence, cigarettes, uncomplicated: Secondary | ICD-10-CM

## 2022-07-13 DIAGNOSIS — Z122 Encounter for screening for malignant neoplasm of respiratory organs: Secondary | ICD-10-CM

## 2022-07-13 DIAGNOSIS — Z87891 Personal history of nicotine dependence: Secondary | ICD-10-CM

## 2022-07-28 ENCOUNTER — Other Ambulatory Visit: Payer: Self-pay | Admitting: Internal Medicine

## 2022-07-28 DIAGNOSIS — R079 Chest pain, unspecified: Secondary | ICD-10-CM

## 2022-08-24 ENCOUNTER — Other Ambulatory Visit (HOSPITAL_COMMUNITY): Payer: Self-pay | Admitting: *Deleted

## 2022-08-24 ENCOUNTER — Telehealth (HOSPITAL_COMMUNITY): Payer: Self-pay | Admitting: *Deleted

## 2022-08-24 MED ORDER — METOPROLOL TARTRATE 100 MG PO TABS: ORAL_TABLET | ORAL | 0 refills | Status: DC

## 2022-08-24 NOTE — Telephone Encounter (Signed)
Attempted to call patient regarding upcoming cardiac CT appointment. °Left message on voicemail with name and callback number ° °  RN Navigator Cardiac Imaging °[facility] °[phone] Office °[phone] Cell ° °

## 2022-08-25 ENCOUNTER — Telehealth (HOSPITAL_COMMUNITY): Payer: Self-pay | Admitting: *Deleted

## 2022-08-25 NOTE — Telephone Encounter (Signed)
Reaching out to patient to offer assistance regarding upcoming cardiac imaging study; pt verbalizes understanding of appt date/time, parking situation and where to check in, pre-test NPO status and medications ordered, and verified current allergies; name and call back number provided for further questions should they arise    RN Navigator Cardiac Imaging [facility] [phone] office [phone] cell  Patient to take 100mg metoprolol tartrate two hours prior to his cardiac CT scan. 

## 2022-08-26 ENCOUNTER — Ambulatory Visit
Admission: RE | Admit: 2022-08-26 | Discharge: 2022-08-26 | Disposition: A | Discharge status: Home / Self Care | Payer: Medicaid Other | Source: Ambulatory Visit | Attending: Internal Medicine | Admitting: Internal Medicine

## 2022-08-26 DIAGNOSIS — R079 Chest pain, unspecified: Secondary | ICD-10-CM | POA: Insufficient documentation

## 2022-08-26 MED ORDER — NITROGLYCERIN 0.4 MG SL SUBL
0.8000 mg | SUBLINGUAL_TABLET | Freq: Once | SUBLINGUAL | Status: AC
  Administered 2022-08-26: 0.8 mg via SUBLINGUAL
  Filled 2022-08-26: qty 25

## 2022-08-26 MED ORDER — IOHEXOL 350 MG/ML SOLN
80.0000 mL | Freq: Once | INTRAVENOUS | Status: AC | PRN
  Administered 2022-08-26: 80 mL via INTRAVENOUS

## 2022-08-26 NOTE — Progress Notes (Signed)
Patient tolerated procedure well. Ambulate w/o difficulty. Denies any lightheadedness or being dizzy. Pt denies any pain at this time. Sitting in chair, pt is encouraged to drink additional water throughout the day and reason explained to patient. Patient verbalized understanding and all questions answered. ABC intact. No further needs at this time. Discharge from procedure area w/o issues.  

## 2022-09-17 ENCOUNTER — Other Ambulatory Visit: Payer: Self-pay | Admitting: Surgery

## 2022-09-18 ENCOUNTER — Encounter
Admission: RE | Admit: 2022-09-18 | Discharge: 2022-09-18 | Disposition: A | Discharge status: Home / Self Care | Payer: Medicaid Other | Source: Ambulatory Visit | Attending: Surgery | Admitting: Surgery

## 2022-09-18 ENCOUNTER — Other Ambulatory Visit: Payer: Medicaid Other

## 2022-09-18 VITALS — Ht 65.0 in | Wt 147.0 lb

## 2022-09-18 DIAGNOSIS — I10 Essential (primary) hypertension: Secondary | ICD-10-CM | POA: Insufficient documentation

## 2022-09-18 DIAGNOSIS — Z01818 Encounter for other preprocedural examination: Secondary | ICD-10-CM | POA: Insufficient documentation

## 2022-09-18 DIAGNOSIS — J449 Chronic obstructive pulmonary disease, unspecified: Secondary | ICD-10-CM | POA: Diagnosis not present

## 2022-09-18 DIAGNOSIS — Z01812 Encounter for preprocedural laboratory examination: Secondary | ICD-10-CM

## 2022-09-18 HISTORY — DX: Essential (primary) hypertension: I10

## 2022-09-18 HISTORY — DX: Hyperlipidemia, unspecified: E78.5

## 2022-09-18 LAB — BASIC METABOLIC PANEL
Anion gap: 8 (ref 5–15)
BUN: 15 mg/dL (ref 8–23)
CO2: 24 mmol/L (ref 22–32)
Calcium: 8.5 mg/dL — ABNORMAL LOW (ref 8.9–10.3)
Chloride: 102 mmol/L (ref 98–111)
Creatinine, Ser: 0.85 mg/dL (ref 0.61–1.24)
GFR, Estimated: >60 mL/min (ref >60)
Glucose, Bld: 106 mg/dL — ABNORMAL HIGH (ref 70–99)
Potassium: 4.2 mmol/L (ref 3.5–5.1)
Sodium: 134 mmol/L — ABNORMAL LOW (ref 135–145)

## 2022-09-18 LAB — CBC
HCT: 46 % (ref 39.0–52.0)
Hemoglobin: 16.1 g/dL (ref 13.0–17.0)
MCH: 30 pg (ref 26.0–34.0)
MCHC: 35 g/dL (ref 30.0–36.0)
MCV: 85.7 fL (ref 80.0–100.0)
Platelets: 218 10*3/uL (ref 150–400)
RBC: 5.37 MIL/uL (ref 4.22–5.81)
RDW: 13.2 % (ref 11.5–15.5)
WBC: 8.3 10*3/uL (ref 4.0–10.5)
nRBC: 0 % (ref 0.0–0.2)

## 2022-09-18 NOTE — Patient Instructions (Addendum)
Your procedure is scheduled on: Wednesday July 31 Report to the Registration Desk on the 1st floor of the [facility] To find out your arrival time, please call [phone] between 1PM - 3PM on: Tuesday July 30  If your arrival time is 6:00 am, do not arrive before that time as the [facility] entrance doors do not open until 6:00 am.  REMEMBER: Instructions that are not followed completely may result in serious medical risk, up to and including death; or upon the discretion of your surgeon and anesthesiologist your surgery may need to be rescheduled.  Do not eat food after midnight the night before surgery.  No gum chewing or hard candies.  You may however, drink CLEAR liquids up to 2 hours before you are scheduled to arrive for your surgery. Do not drink anything within 2 hours of your scheduled arrival time.  Clear liquids include: - water  - apple juice without pulp - gatorade (not RED colors) - black coffee or tea (Do NOT add milk or creamers to the coffee or tea) Do NOT drink anything that is not on this list.  In addition, your doctor has ordered for you to drink the provided:  Ensure Pre-Surgery Clear Carbohydrate Drink  Drinking this carbohydrate drink up to two hours before surgery helps to reduce insulin resistance and improve patient outcomes. Please complete drinking 2 hours before scheduled arrival time.  One week prior to surgery: Starting Wednesday July 24 Stop Anti-inflammatories (NSAIDS) such as Advil, Aleve, Ibuprofen, Motrin, Naproxen, Naprosyn and Aspirin based products such as Excedrin, Goody's Powder, BC Powder. Stop ANY OVER THE COUNTER supplements until after surgery. You may however, continue to take Tylenol if needed for pain up until the day of surgery.  Continue taking all prescribed medications  TAKE ONLY THESE MEDICATIONS THE MORNING OF SURGERY WITH A SIP OF WATER:  oxyCODONE-acetaminophen (PERCOCET) if needed for pain gabapentin (NEURONTIN)   albuterol (PROVENTIL HFA;VENTOLIN HFA)  and bring with you the day of surgery. Duloxetine (Cymbalta) Advair inhaler  No Alcohol for 24 hours before or after surgery.  No Smoking including e-cigarettes for 24 hours before surgery.  No chewable tobacco products for at least 6 hours before surgery.  No nicotine patches on the day of surgery.  Do not use any "recreational" drugs for at least a week (preferably 2 weeks) before your surgery.  Please be advised that the combination of cocaine and anesthesia may have negative outcomes, up to and including death. If you test positive for cocaine, your surgery will be cancelled.  On the morning of surgery brush your teeth with toothpaste and water, you may rinse your mouth with mouthwash if you wish. Do not swallow any toothpaste or mouthwash.  Use CHG Soap as directed on instruction sheet.  Do not wear jewelry, make-up, hairpins, clips or nail polish.  Do not wear lotions, powders, or perfumes.   Do not shave body hair from the neck down 48 hours before surgery.  Contact lenses, hearing aids and dentures may not be worn into surgery.  Do not bring valuables to the hospital. [facility] is not responsible for any missing/lost belongings or valuables.   Notify your doctor if there is any change in your medical condition (cold, fever, infection).  Wear comfortable clothing (specific to your surgery type) to the hospital.  After surgery, you can help prevent lung complications by doing breathing exercises.  Take deep breaths and cough every 1-2 hours.   If you are being discharged  the day of surgery, you will not be allowed to drive home. You will need a responsible individual to drive you home and stay with you for 24 hours after surgery.   If you are taking public transportation, you will need to have a responsible individual with you.  Please call the Pre-admissions Testing Dept. at [phone] if you have any questions about  these instructions.  Surgery Visitation Policy:  Patients having surgery or a procedure may have two visitors.  Children under the age of 41 must have an adult with them who is not the patient.      Preparing for Surgery with CHLORHEXIDINE GLUCONATE (CHG) Soap  Chlorhexidine Gluconate (CHG) Soap  o An antiseptic cleaner that kills germs and bonds with the skin to continue killing germs even after washing  o Used for showering the night before surgery and morning of surgery  Before surgery, you can play an important role by reducing the number of germs on your skin.  CHG (Chlorhexidine gluconate) soap is an antiseptic cleanser which kills germs and bonds with the skin to continue killing germs even after washing.  Please do not use if you have an allergy to CHG or antibacterial soaps. If your skin becomes reddened/irritated stop using the CHG.  1. Shower the NIGHT BEFORE SURGERY and the MORNING OF SURGERY with CHG soap.  2. If you choose to wash your hair, wash your hair first as usual with your normal shampoo.  3. After shampooing, rinse your hair and body thoroughly to remove the shampoo.  4. Use CHG as you would any other liquid soap. You can apply CHG directly to the skin and wash gently with a scrungie or a clean washcloth.  5. Apply the CHG soap to your body only from the neck down. Do not use on open wounds or open sores. Avoid contact with your eyes, ears, mouth, and genitals (private parts). Wash face and genitals (private parts) with your normal soap.  6. Wash thoroughly, paying special attention to the area where your surgery will be performed.  7. Thoroughly rinse your body with warm water.  8. Do not shower/wash with your normal soap after using and rinsing off the CHG soap.  9. Pat yourself dry with a clean towel.  10. Wear clean pajamas to bed the night before surgery.  12. Place clean sheets on your bed the night of your first shower and do not sleep with  pets.  13. Shower again with the CHG soap on the day of surgery prior to arriving at the hospital.  14. Do not apply any deodorants/lotions/powders.  15. Please wear clean clothes to the hospital.

## 2022-09-22 MED ORDER — CHLORHEXIDINE GLUCONATE 0.12 % MT SOLN
15.0000 mL | Freq: Once | OROMUCOSAL | Status: AC
  Administered 2022-09-23: 15 mL via OROMUCOSAL

## 2022-09-22 MED ORDER — CEFAZOLIN SODIUM-DEXTROSE 2-4 GM/100ML-% IV SOLN
2.0000 g | INTRAVENOUS | Status: AC
  Administered 2022-09-23: 2 g via INTRAVENOUS

## 2022-09-22 MED ORDER — FAMOTIDINE 20 MG PO TABS
20.0000 mg | ORAL_TABLET | Freq: Once | ORAL | Status: AC
  Administered 2022-09-23: 20 mg via ORAL

## 2022-09-22 MED ORDER — LACTATED RINGERS IV SOLN: INTRAVENOUS | Status: DC

## 2022-09-22 MED ORDER — ORAL CARE MOUTH RINSE: 15.0000 mL | Freq: Once | OROMUCOSAL | Status: AC

## 2022-09-23 ENCOUNTER — Ambulatory Visit
Admission: RE | Admit: 2022-09-23 | Discharge: 2022-09-23 | Disposition: A | Discharge status: Home / Self Care | Payer: Medicaid Other | Attending: Surgery | Admitting: Surgery

## 2022-09-23 ENCOUNTER — Encounter
Admission: RE | Disposition: A | Discharge status: Home / Self Care | Payer: Self-pay | Source: Home / Self Care | Attending: Surgery

## 2022-09-23 ENCOUNTER — Ambulatory Visit: Payer: Medicaid Other | Admitting: Urgent Care

## 2022-09-23 ENCOUNTER — Encounter: Payer: Self-pay | Admitting: Surgery

## 2022-09-23 ENCOUNTER — Other Ambulatory Visit: Payer: Self-pay

## 2022-09-23 DIAGNOSIS — F419 Anxiety disorder, unspecified: Secondary | ICD-10-CM | POA: Insufficient documentation

## 2022-09-23 DIAGNOSIS — G5601 Carpal tunnel syndrome, right upper limb: Secondary | ICD-10-CM | POA: Diagnosis present

## 2022-09-23 DIAGNOSIS — J4489 Other specified chronic obstructive pulmonary disease: Secondary | ICD-10-CM | POA: Diagnosis not present

## 2022-09-23 DIAGNOSIS — F17210 Nicotine dependence, cigarettes, uncomplicated: Secondary | ICD-10-CM | POA: Insufficient documentation

## 2022-09-23 DIAGNOSIS — E785 Hyperlipidemia, unspecified: Secondary | ICD-10-CM | POA: Diagnosis not present

## 2022-09-23 DIAGNOSIS — I10 Essential (primary) hypertension: Secondary | ICD-10-CM | POA: Insufficient documentation

## 2022-09-23 DIAGNOSIS — Z8673 Personal history of transient ischemic attack (TIA), and cerebral infarction without residual deficits: Secondary | ICD-10-CM | POA: Insufficient documentation

## 2022-09-23 DIAGNOSIS — G4730 Sleep apnea, unspecified: Secondary | ICD-10-CM | POA: Insufficient documentation

## 2022-09-23 DIAGNOSIS — F32A Depression, unspecified: Secondary | ICD-10-CM | POA: Insufficient documentation

## 2022-09-23 HISTORY — PX: CARPAL TUNNEL RELEASE: SHX101

## 2022-09-23 SURGERY — CARPAL TUNNEL RELEASE
Anesthesia: General | Site: Wrist | Laterality: Right

## 2022-09-23 MED ORDER — KETOROLAC TROMETHAMINE 30 MG/ML IJ SOLN
30.0000 mg | Freq: Once | INTRAMUSCULAR | Status: AC
  Administered 2022-09-23: 30 mg via INTRAVENOUS

## 2022-09-23 MED ORDER — ONDANSETRON HCL 4 MG/2ML IJ SOLN: 4.0000 mg | Freq: Once | INTRAMUSCULAR | Status: DC | PRN

## 2022-09-23 MED ORDER — BUPIVACAINE HCL (PF) 0.5 % IJ SOLN
INTRAMUSCULAR | Status: DC | PRN
  Administered 2022-09-23: 20 mL

## 2022-09-23 MED ORDER — OXYCODONE HCL 5 MG/5ML PO SOLN: 5.0000 mg | Freq: Once | ORAL | Status: AC | PRN

## 2022-09-23 MED ORDER — SODIUM CHLORIDE 0.9 % IV SOLN: INTRAVENOUS | Status: DC

## 2022-09-23 MED ORDER — OXYCODONE HCL 5 MG PO TABS
ORAL_TABLET | ORAL | Status: AC
  Filled 2022-09-23: qty 1

## 2022-09-23 MED ORDER — BUPIVACAINE HCL (PF) 0.5 % IJ SOLN
INTRAMUSCULAR | Status: AC
  Filled 2022-09-23: qty 30

## 2022-09-23 MED ORDER — PROPOFOL 10 MG/ML IV BOLUS
INTRAVENOUS | Status: DC | PRN
  Administered 2022-09-23: 160 mg via INTRAVENOUS

## 2022-09-23 MED ORDER — LIDOCAINE HCL (PF) 2 % IJ SOLN
INTRAMUSCULAR | Status: AC
  Filled 2022-09-23: qty 5

## 2022-09-23 MED ORDER — ONDANSETRON HCL 4 MG/2ML IJ SOLN
INTRAMUSCULAR | Status: AC
  Filled 2022-09-23: qty 2

## 2022-09-23 MED ORDER — ONDANSETRON HCL 4 MG/2ML IJ SOLN
INTRAMUSCULAR | Status: DC | PRN
  Administered 2022-09-23: 4 mg via INTRAVENOUS

## 2022-09-23 MED ORDER — FENTANYL CITRATE (PF) 100 MCG/2ML IJ SOLN
INTRAMUSCULAR | Status: AC
  Filled 2022-09-23: qty 2

## 2022-09-23 MED ORDER — ONDANSETRON HCL 4 MG/2ML IJ SOLN: 4.0000 mg | Freq: Four times a day (QID) | INTRAMUSCULAR | Status: DC | PRN

## 2022-09-23 MED ORDER — IPRATROPIUM-ALBUTEROL 0.5-2.5 (3) MG/3ML IN SOLN
RESPIRATORY_TRACT | Status: AC
  Filled 2022-09-23: qty 3

## 2022-09-23 MED ORDER — ACETAMINOPHEN 10 MG/ML IV SOLN
INTRAVENOUS | Status: AC
  Filled 2022-09-23: qty 100

## 2022-09-23 MED ORDER — IPRATROPIUM-ALBUTEROL 0.5-2.5 (3) MG/3ML IN SOLN
3.0000 mL | RESPIRATORY_TRACT | Status: AC
  Administered 2022-09-23: 3 mL via RESPIRATORY_TRACT

## 2022-09-23 MED ORDER — FENTANYL CITRATE (PF) 100 MCG/2ML IJ SOLN
INTRAMUSCULAR | Status: DC | PRN
  Administered 2022-09-23 (×4): 25 ug via INTRAVENOUS

## 2022-09-23 MED ORDER — ONDANSETRON HCL 4 MG PO TABS: 4.0000 mg | ORAL_TABLET | Freq: Four times a day (QID) | ORAL | Status: DC | PRN

## 2022-09-23 MED ORDER — DEXAMETHASONE SODIUM PHOSPHATE 10 MG/ML IJ SOLN
INTRAMUSCULAR | Status: AC
  Filled 2022-09-23: qty 1

## 2022-09-23 MED ORDER — PHENYLEPHRINE HCL (PRESSORS) 10 MG/ML IV SOLN
INTRAVENOUS | Status: DC | PRN
  Administered 2022-09-23 (×2): 160 ug via INTRAVENOUS

## 2022-09-23 MED ORDER — ACETAMINOPHEN 10 MG/ML IV SOLN: 1000.0000 mg | Freq: Once | INTRAVENOUS | Status: DC | PRN

## 2022-09-23 MED ORDER — METOCLOPRAMIDE HCL 5 MG/ML IJ SOLN: 5.0000 mg | Freq: Three times a day (TID) | INTRAMUSCULAR | Status: DC | PRN

## 2022-09-23 MED ORDER — 0.9 % SODIUM CHLORIDE (POUR BTL) OPTIME
TOPICAL | Status: DC | PRN
  Administered 2022-09-23: 500 mL

## 2022-09-23 MED ORDER — OXYCODONE HCL 5 MG PO TABS
5.0000 mg | ORAL_TABLET | Freq: Four times a day (QID) | ORAL | 0 refills | Status: DC | PRN
Start: 2022-09-23 — End: 2022-10-03

## 2022-09-23 MED ORDER — ACETAMINOPHEN 325 MG PO TABS: 325.0000 mg | ORAL_TABLET | Freq: Four times a day (QID) | ORAL | Status: DC | PRN

## 2022-09-23 MED ORDER — LIDOCAINE HCL (CARDIAC) PF 100 MG/5ML IV SOSY
PREFILLED_SYRINGE | INTRAVENOUS | Status: DC | PRN
  Administered 2022-09-23: 100 mg via INTRAVENOUS

## 2022-09-23 MED ORDER — IBUPROFEN 800 MG PO TABS: 800.0000 mg | ORAL_TABLET | Freq: Three times a day (TID) | ORAL | 2 refills | Status: AC | PRN

## 2022-09-23 MED ORDER — OXYCODONE HCL 5 MG PO TABS: 10.0000 mg | ORAL_TABLET | ORAL | Status: DC | PRN

## 2022-09-23 MED ORDER — DEXAMETHASONE SODIUM PHOSPHATE 10 MG/ML IJ SOLN
INTRAMUSCULAR | Status: DC | PRN
  Administered 2022-09-23: 5 mg via INTRAVENOUS

## 2022-09-23 MED ORDER — METOCLOPRAMIDE HCL 10 MG PO TABS: 5.0000 mg | ORAL_TABLET | Freq: Three times a day (TID) | ORAL | Status: DC | PRN

## 2022-09-23 MED ORDER — FAMOTIDINE 20 MG PO TABS
ORAL_TABLET | ORAL | Status: AC
  Filled 2022-09-23: qty 1

## 2022-09-23 MED ORDER — MIDAZOLAM HCL 2 MG/2ML IJ SOLN
INTRAMUSCULAR | Status: DC | PRN
  Administered 2022-09-23 (×2): 1 mg via INTRAVENOUS

## 2022-09-23 MED ORDER — KETOROLAC TROMETHAMINE 30 MG/ML IJ SOLN
INTRAMUSCULAR | Status: AC
  Filled 2022-09-23: qty 1

## 2022-09-23 MED ORDER — CEFAZOLIN SODIUM-DEXTROSE 2-4 GM/100ML-% IV SOLN
INTRAVENOUS | Status: AC
  Filled 2022-09-23: qty 100

## 2022-09-23 MED ORDER — ACETAMINOPHEN 10 MG/ML IV SOLN
INTRAVENOUS | Status: DC | PRN
  Administered 2022-09-23: 1000 mg via INTRAVENOUS

## 2022-09-23 MED ORDER — PROPOFOL 10 MG/ML IV BOLUS
INTRAVENOUS | Status: AC
  Filled 2022-09-23: qty 20

## 2022-09-23 MED ORDER — MIDAZOLAM HCL 2 MG/2ML IJ SOLN
INTRAMUSCULAR | Status: AC
  Filled 2022-09-23: qty 2

## 2022-09-23 MED ORDER — OXYCODONE HCL 5 MG PO TABS
5.0000 mg | ORAL_TABLET | Freq: Once | ORAL | Status: AC | PRN
  Administered 2022-09-23: 5 mg via ORAL

## 2022-09-23 MED ORDER — FENTANYL CITRATE (PF) 100 MCG/2ML IJ SOLN
25.0000 ug | INTRAMUSCULAR | Status: DC | PRN
  Administered 2022-09-23 (×3): 50 ug via INTRAVENOUS

## 2022-09-23 MED ORDER — CHLORHEXIDINE GLUCONATE 0.12 % MT SOLN
OROMUCOSAL | Status: AC
  Filled 2022-09-23: qty 15

## 2022-09-23 SURGICAL SUPPLY — 34 items
APL PRP STRL LF DISP 70% ISPRP (MISCELLANEOUS) ×1
BNDG CMPR 5X2 KNTD ELC UNQ LF (GAUZE/BANDAGES/DRESSINGS) ×1
BNDG CMPR 5X3 KNIT ELC UNQ LF (GAUZE/BANDAGES/DRESSINGS) ×1
BNDG ELASTIC 2INX 5YD STR LF (GAUZE/BANDAGES/DRESSINGS) ×1 IMPLANT
BNDG ELASTIC 3INX 5YD STR LF (GAUZE/BANDAGES/DRESSINGS) ×1 IMPLANT
BNDG ESMARCH 4 X 12 STRL LF (GAUZE/BANDAGES/DRESSINGS) ×1
BNDG ESMARCH 4X12 STRL LF (GAUZE/BANDAGES/DRESSINGS) ×1 IMPLANT
CHLORAPREP W/TINT 26 (MISCELLANEOUS) ×1 IMPLANT
CORD BIP STRL DISP 12FT (MISCELLANEOUS) ×1 IMPLANT
CUFF TOURN SGL QUICK 18X4 (TOURNIQUET CUFF) IMPLANT
DRAPE SURG 17X11 SM STRL (DRAPES) ×2 IMPLANT
FORCEPS JEWEL BIP 4-3/4 STR (INSTRUMENTS) ×1 IMPLANT
GAUZE SPONGE 4X4 12PLY STRL (GAUZE/BANDAGES/DRESSINGS) ×1 IMPLANT
GAUZE XEROFORM 1X8 LF (GAUZE/BANDAGES/DRESSINGS) ×1 IMPLANT
GLOVE BIO SURGEON STRL SZ8 (GLOVE) ×2 IMPLANT
GLOVE INDICATOR 8.0 STRL GRN (GLOVE) ×1 IMPLANT
GOWN STRL REUS W/ TWL LRG LVL3 (GOWN DISPOSABLE) ×1 IMPLANT
GOWN STRL REUS W/ TWL XL LVL3 (GOWN DISPOSABLE) ×1 IMPLANT
GOWN STRL REUS W/TWL LRG LVL3 (GOWN DISPOSABLE) ×1
GOWN STRL REUS W/TWL XL LVL3 (GOWN DISPOSABLE) ×1
KIT TURNOVER KIT A (KITS) ×1 IMPLANT
MANIFOLD NEPTUNE II (INSTRUMENTS) ×1 IMPLANT
NDL HYPO 25X1 1.5 SAFETY (NEEDLE) ×1 IMPLANT
NEEDLE HYPO 25X1 1.5 SAFETY (NEEDLE) ×1 IMPLANT
NS IRRIG 500ML POUR BTL (IV SOLUTION) ×1 IMPLANT
PACK EXTREMITY ARMC (MISCELLANEOUS) ×1 IMPLANT
SPLINT WRIST LG LT TX990309 (SOFTGOODS) ×1 IMPLANT
SPLINT WRIST M LT TX990308 (SOFTGOODS) ×1 IMPLANT
SPLINT WRIST M RT TX990303 (SOFTGOODS) ×1 IMPLANT
STOCKINETTE IMPERVIOUS 9X36 MD (GAUZE/BANDAGES/DRESSINGS) ×1 IMPLANT
SUT PROLENE 4 0 PS 2 18 (SUTURE) ×1 IMPLANT
TRAP FLUID SMOKE EVACUATOR (MISCELLANEOUS) ×1 IMPLANT
VACURETTE 12 RIGID CVD (CANNULA) ×1 IMPLANT
WATER STERILE IRR 500ML POUR (IV SOLUTION) ×1 IMPLANT

## 2022-09-23 NOTE — Anesthesia Preprocedure Evaluation (Addendum)
Anesthesia Evaluation  Patient identified by MRN, date of birth, ID band Patient awake    Reviewed: Allergy & Precautions, NPO status , Patient's Chart, lab work & pertinent test results  History of Anesthesia Complications Negative for: history of anesthetic complications  Airway Mallampati: II  TM Distance: >3 FB Neck ROM: Full    Dental  (+) Upper Dentures, Lower Dentures   Pulmonary asthma , sleep apnea , COPD, Current Smoker and Patient abstained from smoking.   Pulmonary exam normal breath sounds clear to auscultation       Cardiovascular Exercise Tolerance: Good METShypertension, Pt. on medications (-) CAD and (-) Past MI (-) dysrhythmias  Rhythm:Regular Rate:Normal - Systolic murmurs    Neuro/Psych  Headaches PSYCHIATRIC DISORDERS Anxiety Depression    TIA   GI/Hepatic ,neg GERD  ,,(+)     (-) substance abuse    Endo/Other  neg diabetes    Renal/GU negative Renal ROS     Musculoskeletal   Abdominal   Peds  Hematology   Anesthesia Other Findings Past Medical History: No date: Allergic rhinitis No date: Anxiety No date: Arthritis No date: Asthma No date: Chronic back pain No date: COPD (chronic obstructive pulmonary disease) (HCC) No date: Depression No date: Dyspnea No date: Hyperlipidemia No date: Hypertension No date: Migraine headache     Comment:  none since 2016 No date: Right leg numbness     Comment:  occasional, S/P back surgery (per pt) No date: Sleep apnea     Comment:  no CPAP, since 2017 2015: TIA (transient ischemic attack)     Comment:  no deficits  Reproductive/Obstetrics                             Anesthesia Physical Anesthesia Plan  ASA: 3  Anesthesia Plan: General   Post-op Pain Management: Ofirmev IV (intra-op)*   Induction: Intravenous  PONV Risk Score and Plan: 2 and Ondansetron, Dexamethasone and Midazolam  Airway Management Planned:  LMA  Additional Equipment: None  Intra-op Plan:   Post-operative Plan: Extubation in OR  Informed Consent: I have reviewed the patients History and Physical, chart, labs and discussed the procedure including the risks, benefits and alternatives for the proposed anesthesia with the patient or authorized representative who has indicated his/her understanding and acceptance.     Dental advisory given  Plan Discussed with: CRNA and Surgeon  Anesthesia Plan Comments: (Discussed risks of anesthesia with patient, including PONV, sore throat, lip/dental/eye damage. Rare risks discussed as well, such as cardiorespiratory and neurological sequelae, and allergic reactions. Discussed the role of CRNA in patient's perioperative care. Patient understands. Patient counseled on benefits of smoking cessation, and increased perioperative risks associated with continued smoking. Patient has listed allergy to PCN. Severe blistering skin reaction (SJS/TEN)? No Liver or kidney injury caused by PCN? no Hemolytic anemia from PCN? no Drug fever? no Painful swollen joints? no Severe reaction involving inside of mouth, eye, or genital ulcers? no Based on current evidence Elizebeth Koller et al, J Allergy Clin Immunol Pract, 2019), will proceed with cefazolin use: Yes  )       Anesthesia Quick Evaluation

## 2022-09-23 NOTE — Transfer of Care (Signed)
Immediate Anesthesia Transfer of Care Note  Patient: Bruce Harding  Procedure(s) Performed: REVISION OPEN RIGHT CARPAL TUNNEL RELEASE (Right: Wrist)  Patient Location: PACU  Anesthesia Type:General  Level of Consciousness: drowsy  Airway & Oxygen Therapy: Patient connected to face mask oxygen  Post-op Assessment: Report given to RN and Post -op Vital signs reviewed and stable  Post vital signs: stable  Last Vitals:  Vitals Value Taken Time  BP    Temp    Pulse    Resp    SpO2      Last Pain:  Vitals:   09/23/22 1103  PainSc: 9          Complications: No notable events documented.

## 2022-09-23 NOTE — Discharge Instructions (Addendum)
Orthopedic discharge instructions: Keep dressing dry and intact. Keep hand elevated above heart level. May shower after dressing removed on postop day 4 (Sunday). Cover sutures with Band-Aids or reapply Ace wrap after drying off. Apply ice to affected area frequently. Take ibuprofen 800 mg TID with meals for 5-7 days, then as necessary. Take ES Tylenol or pain medication as prescribed when needed.  Return for follow-up in 10-14 days or as scheduled.   AMBULATORY SURGERY  DISCHARGE INSTRUCTIONS   The drugs that you were given will stay in your system until tomorrow so for the next 24 hours you should not:  Drive an automobile Make any legal decisions Drink any alcoholic beverage   You may resume regular meals tomorrow.  Today it is better to start with liquids and gradually work up to solid foods.  You may eat anything you prefer, but it is better to start with liquids, then soup and crackers, and gradually work up to solid foods.   Please notify your doctor immediately if you have any unusual bleeding, trouble breathing, redness and pain at the surgery site, drainage, fever, or pain not relieved by medication.   Please contact your physician with any problems or Same Day Surgery at [phone], Monday through Friday 6 am to 4 pm, or [place] [facility] number at [phone].

## 2022-09-23 NOTE — Anesthesia Postprocedure Evaluation (Signed)
Anesthesia Post Note  Patient: Bruce Harding  Procedure(s) Performed: REVISION OPEN RIGHT CARPAL TUNNEL RELEASE (Right: Wrist)  Patient location during evaluation: PACU Anesthesia Type: General Level of consciousness: awake and alert Pain management: pain level controlled Vital Signs Assessment: post-procedure vital signs reviewed and stable Respiratory status: spontaneous breathing, nonlabored ventilation and respiratory function stable Cardiovascular status: blood pressure returned to baseline and stable Postop Assessment: no apparent nausea or vomiting Anesthetic complications: no   There were no known notable events for this encounter.   Last Vitals:  Vitals:   09/23/22 1717 09/23/22 1724  BP:  119/68  Pulse:    Resp:  16  Temp:    SpO2: 92%     Last Pain:  Vitals:   09/23/22 1700  PainSc: 4                   Romie Minus

## 2022-09-23 NOTE — Op Note (Signed)
09/23/2022  3:44 PM  Patient:   Bruce Harding  Pre-Op Diagnosis:   Recurrent right carpal tunnel syndrome.  Post-Op Diagnosis:   Same.  Procedure:   Open right carpal tunnel release.  Surgeon:   Maryagnes Amos, MD  Anesthesia:   General LMA  Findings:   As above.  Complications:   None  EBL:   0 cc  Fluids:   500 cc crystalloid  TT:   37 minutes at 250 mmHg  Drains:   None  Closure:   4-0 Prolene interrupted sutures  Brief Clinical Note:   The patient is a 62 year old male who first underwent an open carpal tunnel release over 30 years ago. He did well for 25 years before his symptoms recurred. He underwent a second open carpal tunnel release 5 years ago with an excision of a volar carpal ganglion. Again he did quite well until a few months ago when he developed recurrent burning pain in the volar aspect of his right wrist and hand. An EMG/NCV has confirmed the presence of recurrent carpal tunnel syndrome. The patient presents at this time for a revision open right carpal tunnel release.   Procedure:   The patient was brought into the operating room and lain in the supine position. After adequate general laryngeal mask anesthesia was achieved, the right hand and upper extremity were prepped with ChloraPrep solution before being draped sterilely. Preoperative antibiotics were administered. A timeout was performed to verify the appropriate surgical site before the limb was exsanguinated with an Esmarch and the tourniquet inflated to 250 mmHg.   Utilizing the previous scar on the volar aspect of the palm and wrist, a 10 to 12 cm incision was made with care taken to avoid crossing the wrist flexor crease at a right angle. The incision was carried down through the subcutaneous tissues with care taken to identify and protect any neurovascular structures. The distal forearm fascia was penetrated just proximal to the transverse carpal ligament. The soft tissues were released off the  superficial and deep surfaces of the distal forearm fascia and this was released proximally for 3-4 cm under direct visualization. The nerve was visualized directly before a Therapist, nutritional was passed beneath the transverse carpal ligament along the ulnar aspect of the carpal tunnel and used to release any adhesions as well as to remove any adherent synovial tissue. It also was used to protect the underlying nerve as the recurrent scar tissue and residual transverse carpal ligament was transected in line with the incision. Several adhesions along the ulnar side of the nerve were released to allow the nerve to move more freely within the carpal tunnel. In addition, adhesions were released along the radial side of the nerve as well. Hemostasis was achieved using bipolar electrocautery.  The wound was irrigated thoroughly with sterile saline solution before being closed using 4-0 Prolene interrupted sutures. A total of 10 cc of 0.5% plain Sensorcaine was injected in and around the incision before a sterile bulky dressing was applied to the wound. The patient was placed into a volar wrist splint before being awakened, extubated, and returned to the recovery room in satisfactory condition after tolerating the procedure well.
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
## 2022-09-24 ENCOUNTER — Emergency Department: Payer: Medicaid Other

## 2022-09-24 ENCOUNTER — Inpatient Hospital Stay
Admission: EM | Admit: 2022-09-24 | Discharge: 2022-10-03 | DRG: 190 | Disposition: A | Discharge status: Home / Self Care | Payer: Medicaid Other | Attending: Internal Medicine | Admitting: Internal Medicine

## 2022-09-24 ENCOUNTER — Encounter: Payer: Self-pay | Admitting: Surgery

## 2022-09-24 ENCOUNTER — Other Ambulatory Visit: Payer: Self-pay

## 2022-09-24 DIAGNOSIS — Z7982 Long term (current) use of aspirin: Secondary | ICD-10-CM

## 2022-09-24 DIAGNOSIS — F17200 Nicotine dependence, unspecified, uncomplicated: Secondary | ICD-10-CM | POA: Diagnosis not present

## 2022-09-24 DIAGNOSIS — E785 Hyperlipidemia, unspecified: Secondary | ICD-10-CM | POA: Diagnosis present

## 2022-09-24 DIAGNOSIS — R911 Solitary pulmonary nodule: Secondary | ICD-10-CM | POA: Diagnosis present

## 2022-09-24 DIAGNOSIS — M549 Dorsalgia, unspecified: Secondary | ICD-10-CM | POA: Diagnosis present

## 2022-09-24 DIAGNOSIS — J9602 Acute respiratory failure with hypercapnia: Secondary | ICD-10-CM | POA: Diagnosis present

## 2022-09-24 DIAGNOSIS — G894 Chronic pain syndrome: Secondary | ICD-10-CM | POA: Diagnosis not present

## 2022-09-24 DIAGNOSIS — J441 Chronic obstructive pulmonary disease with (acute) exacerbation: Principal | ICD-10-CM | POA: Diagnosis present

## 2022-09-24 DIAGNOSIS — I708 Atherosclerosis of other arteries: Secondary | ICD-10-CM | POA: Diagnosis present

## 2022-09-24 DIAGNOSIS — F32A Depression, unspecified: Secondary | ICD-10-CM | POA: Diagnosis present

## 2022-09-24 DIAGNOSIS — F419 Anxiety disorder, unspecified: Secondary | ICD-10-CM | POA: Diagnosis not present

## 2022-09-24 DIAGNOSIS — J9601 Acute respiratory failure with hypoxia: Secondary | ICD-10-CM | POA: Diagnosis present

## 2022-09-24 DIAGNOSIS — Z880 Allergy status to penicillin: Secondary | ICD-10-CM | POA: Diagnosis not present

## 2022-09-24 DIAGNOSIS — J432 Centrilobular emphysema: Secondary | ICD-10-CM | POA: Diagnosis present

## 2022-09-24 DIAGNOSIS — G8929 Other chronic pain: Secondary | ICD-10-CM | POA: Diagnosis present

## 2022-09-24 DIAGNOSIS — Z79899 Other long term (current) drug therapy: Secondary | ICD-10-CM

## 2022-09-24 DIAGNOSIS — Z1152 Encounter for screening for COVID-19: Secondary | ICD-10-CM | POA: Diagnosis not present

## 2022-09-24 DIAGNOSIS — Z8673 Personal history of transient ischemic attack (TIA), and cerebral infarction without residual deficits: Secondary | ICD-10-CM | POA: Diagnosis not present

## 2022-09-24 DIAGNOSIS — Z885 Allergy status to narcotic agent status: Secondary | ICD-10-CM | POA: Diagnosis not present

## 2022-09-24 DIAGNOSIS — I10 Essential (primary) hypertension: Secondary | ICD-10-CM | POA: Diagnosis present

## 2022-09-24 DIAGNOSIS — K5900 Constipation, unspecified: Secondary | ICD-10-CM | POA: Diagnosis not present

## 2022-09-24 DIAGNOSIS — Z48811 Encounter for surgical aftercare following surgery on the nervous system: Secondary | ICD-10-CM | POA: Diagnosis not present

## 2022-09-24 DIAGNOSIS — F410 Panic disorder [episodic paroxysmal anxiety] without agoraphobia: Secondary | ICD-10-CM | POA: Diagnosis present

## 2022-09-24 DIAGNOSIS — R0989 Other specified symptoms and signs involving the circulatory and respiratory systems: Secondary | ICD-10-CM | POA: Diagnosis present

## 2022-09-24 DIAGNOSIS — R0602 Shortness of breath: Secondary | ICD-10-CM | POA: Diagnosis present

## 2022-09-24 DIAGNOSIS — F17210 Nicotine dependence, cigarettes, uncomplicated: Secondary | ICD-10-CM | POA: Diagnosis present

## 2022-09-24 DIAGNOSIS — Z79891 Long term (current) use of opiate analgesic: Secondary | ICD-10-CM | POA: Diagnosis not present

## 2022-09-24 DIAGNOSIS — J449 Chronic obstructive pulmonary disease, unspecified: Secondary | ICD-10-CM | POA: Diagnosis present

## 2022-09-24 DIAGNOSIS — Z7951 Long term (current) use of inhaled steroids: Secondary | ICD-10-CM | POA: Diagnosis not present

## 2022-09-24 HISTORY — DX: Chronic obstructive pulmonary disease with (acute) exacerbation: J44.1

## 2022-09-24 LAB — CBC
HCT: 42 % (ref 39.0–52.0)
Hemoglobin: 13.9 g/dL (ref 13.0–17.0)
MCH: 29.4 pg (ref 26.0–34.0)
MCHC: 33.1 g/dL (ref 30.0–36.0)
MCV: 88.8 fL (ref 80.0–100.0)
Platelets: 240 10*3/uL (ref 150–400)
RBC: 4.73 MIL/uL (ref 4.22–5.81)
RDW: 13.2 % (ref 11.5–15.5)
WBC: 16.3 10*3/uL — ABNORMAL HIGH (ref 4.0–10.5)
nRBC: 0 % (ref 0.0–0.2)

## 2022-09-24 LAB — BLOOD GAS, VENOUS
Acid-Base Excess: 4.5 mmol/L — ABNORMAL HIGH (ref 0.0–2.0)
Bicarbonate: 30.4 mmol/L — ABNORMAL HIGH (ref 20.0–28.0)
O2 Saturation: 79.4 %
Patient temperature: 37
pCO2, Ven: 49 mmHg (ref 44–60)
pH, Ven: 7.4 (ref 7.25–7.43)
pO2, Ven: 46 mmHg — ABNORMAL HIGH (ref 32–45)

## 2022-09-24 LAB — COMPREHENSIVE METABOLIC PANEL
ALT: 12 U/L (ref 0–44)
AST: 16 U/L (ref 15–41)
Albumin: 3.7 g/dL (ref 3.5–5.0)
Alkaline Phosphatase: 66 U/L (ref 38–126)
Anion gap: 12 (ref 5–15)
BUN: 12 mg/dL (ref 8–23)
CO2: 23 mmol/L (ref 22–32)
Calcium: 8.7 mg/dL — ABNORMAL LOW (ref 8.9–10.3)
Chloride: 101 mmol/L (ref 98–111)
Creatinine, Ser: 0.9 mg/dL (ref 0.61–1.24)
GFR, Estimated: >60 mL/min (ref >60)
Glucose, Bld: 118 mg/dL — ABNORMAL HIGH (ref 70–99)
Potassium: 3.6 mmol/L (ref 3.5–5.1)
Sodium: 136 mmol/L (ref 135–145)
Total Bilirubin: 0.8 mg/dL (ref 0.3–1.2)
Total Protein: 7.1 g/dL (ref 6.5–8.1)

## 2022-09-24 LAB — TROPONIN I (HIGH SENSITIVITY): Troponin I (High Sensitivity): 8 ng/L (ref <18)

## 2022-09-24 MED ORDER — GABAPENTIN 300 MG PO CAPS
600.0000 mg | ORAL_CAPSULE | Freq: Three times a day (TID) | ORAL | Status: DC
  Administered 2022-09-24 – 2022-09-26 (×7): 600 mg via ORAL
  Filled 2022-09-24 (×8): qty 2

## 2022-09-24 MED ORDER — GUAIFENESIN ER 600 MG PO TB12
600.0000 mg | ORAL_TABLET | Freq: Two times a day (BID) | ORAL | Status: DC | PRN
  Administered 2022-09-24 – 2022-09-25 (×2): 600 mg via ORAL
  Filled 2022-09-24 (×3): qty 1

## 2022-09-24 MED ORDER — PANTOPRAZOLE SODIUM 40 MG IV SOLR
40.0000 mg | Freq: Two times a day (BID) | INTRAVENOUS | Status: DC
  Administered 2022-09-24 – 2022-09-27 (×7): 40 mg via INTRAVENOUS
  Filled 2022-09-24 (×7): qty 10

## 2022-09-24 MED ORDER — IPRATROPIUM-ALBUTEROL 0.5-2.5 (3) MG/3ML IN SOLN
3.0000 mL | Freq: Once | RESPIRATORY_TRACT | Status: AC
  Administered 2022-09-24: 3 mL via RESPIRATORY_TRACT
  Filled 2022-09-24: qty 3

## 2022-09-24 MED ORDER — HEPARIN SODIUM (PORCINE) 5000 UNIT/ML IJ SOLN
5000.0000 [IU] | Freq: Three times a day (TID) | INTRAMUSCULAR | Status: DC
  Administered 2022-09-24 – 2022-09-25 (×3): 5000 [IU] via SUBCUTANEOUS
  Filled 2022-09-24 (×3): qty 1

## 2022-09-24 MED ORDER — OXYCODONE HCL 5 MG PO TABS
10.0000 mg | ORAL_TABLET | Freq: Three times a day (TID) | ORAL | Status: DC | PRN
  Administered 2022-09-24 – 2022-09-26 (×5): 10 mg via ORAL
  Filled 2022-09-24 (×5): qty 2

## 2022-09-24 MED ORDER — AMLODIPINE BESYLATE 5 MG PO TABS
2.5000 mg | ORAL_TABLET | Freq: Every day | ORAL | Status: DC
  Administered 2022-09-24 – 2022-10-02 (×9): 2.5 mg via ORAL
  Filled 2022-09-24 (×9): qty 1

## 2022-09-24 MED ORDER — SODIUM CHLORIDE 0.9% FLUSH
3.0000 mL | Freq: Two times a day (BID) | INTRAVENOUS | Status: DC
  Administered 2022-09-24 – 2022-10-02 (×15): 3 mL via INTRAVENOUS

## 2022-09-24 MED ORDER — ACETAMINOPHEN 650 MG RE SUPP: 650.0000 mg | Freq: Four times a day (QID) | RECTAL | Status: DC | PRN

## 2022-09-24 MED ORDER — IPRATROPIUM-ALBUTEROL 0.5-2.5 (3) MG/3ML IN SOLN
3.0000 mL | Freq: Four times a day (QID) | RESPIRATORY_TRACT | Status: DC
  Administered 2022-09-24 – 2022-09-26 (×8): 3 mL via RESPIRATORY_TRACT
  Filled 2022-09-24 (×9): qty 3

## 2022-09-24 MED ORDER — ALBUTEROL SULFATE HFA 108 (90 BASE) MCG/ACT IN AERS: 2.0000 | INHALATION_SPRAY | Freq: Four times a day (QID) | RESPIRATORY_TRACT | Status: DC | PRN

## 2022-09-24 MED ORDER — TIOTROPIUM BROMIDE MONOHYDRATE 18 MCG IN CAPS
18.0000 ug | ORAL_CAPSULE | Freq: Every day | RESPIRATORY_TRACT | Status: DC
  Administered 2022-09-25 – 2022-09-26 (×2): 18 ug via RESPIRATORY_TRACT
  Filled 2022-09-24: qty 5

## 2022-09-24 MED ORDER — ASPIRIN 81 MG PO CHEW: 81.0000 mg | CHEWABLE_TABLET | Freq: Every day | ORAL | Status: DC

## 2022-09-24 MED ORDER — PREDNISONE 20 MG PO TABS
40.0000 mg | ORAL_TABLET | Freq: Every day | ORAL | Status: DC
  Administered 2022-09-26: 40 mg via ORAL
  Filled 2022-09-24: qty 2

## 2022-09-24 MED ORDER — SODIUM CHLORIDE 0.9 % IV SOLN
2.0000 g | Freq: Three times a day (TID) | INTRAVENOUS | Status: DC
  Administered 2022-09-24 – 2022-09-25 (×3): 2 g via INTRAVENOUS
  Filled 2022-09-24 (×3): qty 12.5

## 2022-09-24 MED ORDER — METHYLPREDNISOLONE SODIUM SUCC 125 MG IJ SOLR
60.0000 mg | Freq: Two times a day (BID) | INTRAMUSCULAR | Status: AC
  Administered 2022-09-24 – 2022-09-25 (×2): 60 mg via INTRAVENOUS
  Filled 2022-09-24 (×2): qty 2

## 2022-09-24 MED ORDER — FLUTICASONE FUROATE-VILANTEROL 200-25 MCG/ACT IN AEPB
1.0000 | INHALATION_SPRAY | Freq: Every day | RESPIRATORY_TRACT | Status: DC
  Administered 2022-09-25 – 2022-09-26 (×2): 1 via RESPIRATORY_TRACT
  Filled 2022-09-24: qty 28

## 2022-09-24 MED ORDER — IOHEXOL 350 MG/ML SOLN
75.0000 mL | Freq: Once | INTRAVENOUS | Status: AC | PRN
  Administered 2022-09-24: 75 mL via INTRAVENOUS

## 2022-09-24 MED ORDER — ALBUTEROL SULFATE (2.5 MG/3ML) 0.083% IN NEBU
2.5000 mg | INHALATION_SOLUTION | RESPIRATORY_TRACT | Status: DC | PRN
  Administered 2022-09-25 – 2022-09-28 (×5): 2.5 mg via RESPIRATORY_TRACT
  Filled 2022-09-24 (×6): qty 3

## 2022-09-24 MED ORDER — DULOXETINE HCL 30 MG PO CPEP: 30.0000 mg | ORAL_CAPSULE | Freq: Two times a day (BID) | ORAL | Status: DC

## 2022-09-24 MED ORDER — NICOTINE 14 MG/24HR TD PT24
14.0000 mg | MEDICATED_PATCH | Freq: Every day | TRANSDERMAL | Status: DC
  Administered 2022-09-24 – 2022-10-03 (×10): 14 mg via TRANSDERMAL
  Filled 2022-09-24 (×10): qty 1

## 2022-09-24 MED ORDER — ACETAMINOPHEN 325 MG PO TABS
650.0000 mg | ORAL_TABLET | Freq: Four times a day (QID) | ORAL | Status: DC | PRN
  Administered 2022-09-26 – 2022-10-02 (×5): 650 mg via ORAL
  Filled 2022-09-24 (×5): qty 2

## 2022-09-24 MED ORDER — LACTATED RINGERS IV SOLN: INTRAVENOUS | Status: AC

## 2022-09-24 MED ORDER — OXYCODONE HCL 5 MG PO TABS
5.0000 mg | ORAL_TABLET | Freq: Four times a day (QID) | ORAL | Status: AC | PRN
  Administered 2022-09-24 – 2022-09-25 (×3): 5 mg via ORAL
  Filled 2022-09-24 (×3): qty 1

## 2022-09-24 MED ORDER — MAGNESIUM SULFATE 2 GM/50ML IV SOLN
2.0000 g | Freq: Once | INTRAVENOUS | Status: AC
  Administered 2022-09-24: 2 g via INTRAVENOUS
  Filled 2022-09-24: qty 50

## 2022-09-24 MED ORDER — HYDRALAZINE HCL 20 MG/ML IJ SOLN: 5.0000 mg | INTRAMUSCULAR | Status: DC | PRN

## 2022-09-24 NOTE — ED Triage Notes (Signed)
Pt. To ED from home via [facility] for shortness of breath. Pt. Has hx of COPD and emphysema. Pt. Had wrist surgery yesterday. Pt on no oxygen at home, 2L [place]  via medic. Pt. Denies fever, n/v/d, or recent illness. Pt. Smokes 1/2-1 ppd.

## 2022-09-24 NOTE — ED Provider Notes (Signed)
[facility] Provider Note    Event Date/Time   First MD Initiated Contact with Patient 09/24/22 1532     (approximate)   History   Shortness of Breath   HPI  Bruce Harding is a 62 y.o. male with history of COPD, not on home oxygen who presents with shortness of breath.  He continues to smoke half a pack a day.  Denies fevers chills or cough.  Reports worsening shortness of breath over the last 1 to 2 days.  EMS gave IV Solu-Medrol     Physical Exam   Triage Vital Signs: ED Triage Vitals  Encounter Vitals Group     BP 09/24/22 1535 122/86     Systolic BP Percentile --      Diastolic BP Percentile --      Pulse Rate 09/24/22 1535 (!) 108     Resp 09/24/22 1535 (!) 28     Temp 09/24/22 1536 99 F (37.2 C)     Temp Source 09/24/22 1536 Oral     SpO2 09/24/22 1535 96 %     Weight 09/24/22 1537 66.7 kg (147 lb)     Height 09/24/22 1537 1.651 m (5\' 5" )     Head Circumference --      Peak Flow --      Pain Score 09/24/22 1536 7     Pain Loc --      Pain Education --      Exclude from Growth Chart --     Most recent vital signs: Vitals:   09/24/22 1536 09/24/22 1542  BP: 122/86   Pulse: (!) 106   Resp: 18   Temp: 99 F (37.2 C)   SpO2: 93% (!) 2%     General: Awake, no distress.  CV:  Good peripheral perfusion.  Resp:  Increased effort with tachypnea, diffuse wheezing Abd:  No distention.  Other:  No lower extremity swelling   ED Results / Procedures / Treatments   Labs (all labs ordered are listed, but only abnormal results are displayed) Labs Reviewed  CBC - Abnormal; Notable for the following components:      Result Value   WBC 16.3 (*)    All other components within normal limits  COMPREHENSIVE METABOLIC PANEL - Abnormal; Notable for the following components:   Glucose, Bld 118 (*)    Calcium 8.7 (*)    All other components within normal limits  TROPONIN I (HIGH SENSITIVITY)     EKG  ED ECG REPORT I, Jene Every, the attending physician, personally viewed and interpreted this ECG.  Date: 09/24/2022  Rhythm: normal sinus rhythm QRS Axis: normal Intervals: normal ST/T Wave abnormalities: normal Narrative Interpretation: no evidence of acute ischemia    RADIOLOGY Chest x-ray viewed interpret by me, no pneumonia    PROCEDURES:  Critical Care performed:   Procedures   MEDICATIONS ORDERED IN ED: Medications  ipratropium-albuterol (DUONEB) 0.5-2.5 (3) MG/3ML nebulizer solution 3 mL (3 mLs Nebulization Given 09/24/22 1548)  ipratropium-albuterol (DUONEB) 0.5-2.5 (3) MG/3ML nebulizer solution 3 mL (3 mLs Nebulization Given 09/24/22 1547)  magnesium sulfate IVPB 2 g 50 mL (2 g Intravenous New Bag/Given 09/24/22 1547)     IMPRESSION / MDM / ASSESSMENT AND PLAN / ED COURSE  I reviewed the triage vital signs and the nursing notes. Patient's presentation is most consistent with severe exacerbation of chronic illness.  Patient with a history of the presents with shortness of breath, he is wheezing on exam.  This is consistent with COPD exacerbation, he has 2 L nasal cannula on, on room air oxygen saturations are 89 to 91%.  No fever to suggest pneumonia but pending chest x-ray  Treat with DuoNebs, IV magnesium, will check labs and reevaluate  Lab work notable for elevated white blood cell count of 16.3, no evidence of pneumonia on chest x-ray.  ----------------------------------------- 5:32 PM on 09/24/2022 ----------------------------------------- On reevaluation patient with marked tachypnea and shortness of breath with ambulating in the room, he will require admission for further treatment.      FINAL CLINICAL IMPRESSION(S) / ED DIAGNOSES   Final diagnoses:  COPD exacerbation (HCC)     Rx / DC Orders   ED Discharge Orders     None        Note:  This document was prepared using Dragon voice recognition software and may include unintentional dictation errors.   Jene Every, MD 09/24/22 [phone]

## 2022-09-24 NOTE — ED Notes (Signed)
Pt stated "I told the nurse if she doesn't take this out I will. Its hurting and needs to come out now." Pt states he will let RN get a new IV in a different location.

## 2022-09-24 NOTE — H&P (Addendum)
History and Physical    Patient: Bruce Harding QMV:[id number] DOB: December 16, 1960 DOA: 09/24/2022 DOS: the patient was seen and examined on 09/25/2022 PCP: Mickel Fuchs, MD  Patient coming from: Home   Chief Complaint:  Chief Complaint  Patient presents with   Shortness of Breath    HPI: Bruce Harding is a 62 y.o. male with medical history significant for hypertension, TIA, COPD PE, tobacco abuse presenting with complaints of shortness of breath has been going on for the past few days worse over the past 1 to 2 days.  Patient does continue to smoke about a half a pack a day but no fevers chills hemoptysis nausea vomiting rash diarrhea ill contacts.  Patient did receive Solu-Medrol by EMS.  Patient revision open right carpal tunnel release yesterday.  No history of chronic respiratory failure. In the emergency room patient is alert awake and oriented meeting SIRS criteria with vitals afebrile with temperature of 99 O2 sats of 96% on 2 L. EKG shows sinus rhythm with normal intervals no ST-T wave changes.  Chest x-ray done today negative for any acute cardiopulmonary process, CT angio chest Done to identify and rule out pulmonary embolism shows emphysema with bilateral bronchial wall thickening greatest in the lower lobes with superimposed bronchitis stable 5 mm subpleural right middle lobe nodule with recommendations for routine surveillance and screening, aortic atherosclerosis and CAD.  Review of Systems: Review of Systems  Respiratory:  Positive for cough and shortness of breath.   All other systems reviewed and are negative.  Past Medical History:  Diagnosis Date   Allergic rhinitis    Anxiety    Arthritis    Asthma    Chronic back pain    COPD (chronic obstructive pulmonary disease) (HCC)    Depression    Dyspnea    Hyperlipidemia    Hypertension    Migraine headache    none since 2016   Right leg numbness    occasional, S/P back surgery (per pt)   Sleep apnea    no  CPAP, since 2017   TIA (transient ischemic attack) 2015   no deficits   Past Surgical History:  Procedure Laterality Date   CARPAL TUNNEL RELEASE     CARPAL TUNNEL RELEASE Right 10/13/2017   Procedure: CARPAL TUNNEL RELEASE;  Surgeon: Christena Flake, MD;  Location: [facility];  Service: Orthopedics;  Laterality: Right;   CARPAL TUNNEL RELEASE Right 09/23/2022   Procedure: REVISION OPEN RIGHT CARPAL TUNNEL RELEASE;  Surgeon: Christena Flake, MD;  Location: [facility];  Service: Orthopedics;  Laterality: Right;   GANGLION CYST EXCISION Right 10/13/2017   Procedure: REMOVAL GANGLION OF WRIST;  Surgeon: Christena Flake, MD;  Location: [facility];  Service: Orthopedics;  Laterality: Right;   KNEE SURGERY     LAMINECTOMY     LUMBAR DISC SURGERY     Social History:  reports that he quit smoking about 5 years ago. His smoking use included cigarettes. He started smoking about 60 years ago. He has a 55 pack-year smoking history. He has never used smokeless tobacco. He reports that he does not drink alcohol and does not use drugs.  Allergies  Allergen Reactions   Amoxicillin Diarrhea   Morphine Nausea And Vomiting    Morphine Analogues    No family history on file.  Prior to Admission medications   Medication Sig Start Date End Date Taking? Authorizing Provider  albuterol (PROVENTIL HFA;VENTOLIN HFA) 108 (90 Base) MCG/ACT inhaler Inhale  2 puffs into the lungs every 6 (six) hours as needed for wheezing or shortness of breath. 04/04/18   Don Perking, [place], MD  amLODipine (NORVASC) 2.5 MG tablet Take 1 tablet by mouth at bedtime. 06/26/15   [provider]  aspirin 81 MG chewable tablet Chew 81 mg by mouth daily.    [provider]  DULoxetine (CYMBALTA) 30 MG capsule Take 1 capsule by mouth 2 (two) times daily. 06/26/15   [provider]  fluticasone-salmeterol (ADVAIR) 500-50 MCG/ACT AEPB Inhale 1 puff into the lungs in the morning and at bedtime.    [provider]  gabapentin (NEURONTIN) 300 MG capsule Take 600 mg by mouth 3 (three) times daily.    [provider]  ibuprofen (ADVIL) 800 MG tablet Take 1 tablet (800 mg total) by mouth every 8 (eight) hours as needed for mild pain or moderate pain. 09/23/22   Poggi, Excell Seltzer, MD  montelukast (SINGULAIR) 10 MG tablet Take 10 mg by mouth at bedtime.    [provider]  oxyCODONE (ROXICODONE) 5 MG immediate release tablet Take 1-2 tablets (5-10 mg total) by mouth every 6 (six) hours as needed for breakthrough pain. 09/23/22   Poggi, Excell Seltzer, MD  oxyCODONE-acetaminophen (PERCOCET) 10-325 MG tablet Take 1 tablet by mouth every 6 (six) hours as needed. 10/13/17   Poggi, Excell Seltzer, MD  senna (SENOKOT) 8.6 MG tablet Take 1 tablet by mouth daily.    [provider]  tiotropium (SPIRIVA) 18 MCG inhalation capsule Place 18 mcg into inhaler and inhale daily.    [provider]     Vitals:   09/24/22 2110 09/24/22 2112 09/24/22 2146 09/24/22 2310  BP: (!) 157/71   121/74  Pulse: 96   84  Resp: (!) 30 (!) 24  20  Temp: 97.6 F (36.4 C)   97.8 F (36.6 C)  TempSrc: Axillary   Oral  SpO2: 95%  95% 95%  Weight:      Height:       Physical Exam Vitals and nursing note reviewed.  Constitutional:      General: He is not in acute distress. HENT:     Head: Normocephalic and atraumatic.     Right Ear: Hearing normal.     Left Ear: Hearing normal.     Nose: Nose normal. No nasal deformity.     Mouth/Throat:     Lips: Pink.     Tongue: No lesions.     Pharynx: Oropharynx is clear.  Eyes:     General: Lids are normal.     Extraocular Movements: Extraocular movements intact.  Cardiovascular:     Rate and Rhythm: Normal rate and regular rhythm.     Pulses:          Dorsalis pedis pulses are 1+ on the right side and 2+ on the left side.       Posterior tibial pulses are 1+ on the right side and 2+ on the left side.     Heart sounds: Normal heart sounds.  Pulmonary:      Effort: Pulmonary effort is normal.     Breath sounds: Examination of the right-middle field reveals wheezing. Examination of the left-middle field reveals wheezing. Examination of the right-lower field reveals wheezing. Examination of the left-lower field reveals wheezing. Wheezing present.     Comments: SpO2: 95 % O2 Flow Rate (L/min): 3 L/min    Abdominal:     General: Bowel sounds are normal. There is no  distension.     Palpations: Abdomen is soft. There is no mass.     Tenderness: There is no abdominal tenderness.  Musculoskeletal:     Right lower leg: No edema.     Left lower leg: No edema.  Skin:    General: Skin is warm.  Neurological:     General: No focal deficit present.     Mental Status: He is alert and oriented to person, place, and time.     Cranial Nerves: Cranial nerves 2-12 are intact.  Psychiatric:        Attention and Perception: Attention normal.        Mood and Affect: Mood normal.        Speech: Speech normal.        Behavior: Behavior normal. Behavior is cooperative.     Labs on Admission: I have personally reviewed following labs and imaging studies  CBC: Recent Labs  Lab 09/18/22 1158 09/24/22 1537  WBC 8.3 16.3*  HGB 16.1 13.9  HCT 46.0 42.0  MCV 85.7 88.8  PLT 218 240   Basic Metabolic Panel: Recent Labs  Lab 09/18/22 1158 09/24/22 1537  NA 134* 136  K 4.2 3.6  CL 102 101  CO2 24 23  GLUCOSE 106* 118*  BUN 15 12  CREATININE 0.85 0.90  CALCIUM 8.5* 8.7*   GFR: Estimated Creatinine Clearance: 73.1 mL/min (by C-G formula based on SCr of 0.9 mg/dL). Liver Function Tests: Recent Labs  Lab 09/24/22 1537  AST 16  ALT 12  ALKPHOS 66  BILITOT 0.8  PROT 7.1  ALBUMIN 3.7   No results for input(s): "LIPASE", "AMYLASE" in the last 168 hours. No results for input(s): "AMMONIA" in the last 168 hours. Coagulation Profile: No results for input(s): "INR", "PROTIME" in the last 168 hours. Cardiac Enzymes: No results for input(s):  "CKTOTAL", "CKMB", "CKMBINDEX", "TROPONINI" in the last 168 hours. BNP (last 3 results) No results for input(s): "PROBNP" in the last 8760 hours. HbA1C: No results for input(s): "HGBA1C" in the last 72 hours. CBG: No results for input(s): "GLUCAP" in the last 168 hours. Lipid Profile: No results for input(s): "CHOL", "HDL", "LDLCALC", "TRIG", "CHOLHDL", "LDLDIRECT" in the last 72 hours. Thyroid Function Tests: No results for input(s): "TSH", "T4TOTAL", "FREET4", "T3FREE", "THYROIDAB" in the last 72 hours. Anemia Panel: No results for input(s): "VITAMINB12", "FOLATE", "FERRITIN", "TIBC", "IRON", "RETICCTPCT" in the last 72 hours. Urine analysis: Urinalysis No results found for: "COLORURINE", "APPEARANCEUR", "LABSPEC", "PHURINE", "GLUCOSEU", "HGBUR", "BILIRUBINUR", "KETONESUR", "PROTEINUR", "UROBILINOGEN", "NITRITE", "LEUKOCYTESUR"  Unresulted Labs (From admission, onward)     Start     Ordered   09/25/22 0501  HIV Antibody (routine testing w rflx)  Once,   R        09/25/22 0501   09/25/22 0500  Comprehensive metabolic panel  Tomorrow morning,   R        09/24/22 2007   09/25/22 0500  CBC  Tomorrow morning,   R        09/24/22 2007            Radiological Exams on Admission: CT Angio Chest Pulmonary Embolism (PE) W or WO Contrast  Result Date: 09/24/2022 CLINICAL DATA:  Short of breath, COPD EXAM: CT ANGIOGRAPHY CHEST WITH CONTRAST TECHNIQUE: Multidetector CT imaging of the chest was performed using the standard protocol during bolus administration of intravenous contrast. Multiplanar CT image reconstructions and MIPs were obtained to evaluate the vascular anatomy. RADIATION DOSE REDUCTION: This exam was performed according to  the departmental dose-optimization program which includes automated exposure control, adjustment of the mA and/or kV according to patient size and/or use of iterative reconstruction technique. CONTRAST:  75mL OMNIPAQUE IOHEXOL 350 MG/ML SOLN COMPARISON:   09/24/2022, 08/26/2022, 07/09/2022 FINDINGS: Cardiovascular: This is a technically adequate evaluation of the pulmonary vasculature. No filling defects or pulmonary emboli. The heart is unremarkable without pericardial effusion. No evidence of thoracic aortic aneurysm or dissection. Stable atherosclerosis of the aorta and coronary vasculature. Mediastinum/Nodes: No enlarged mediastinal, hilar, or axillary lymph nodes. Thyroid gland, trachea, and esophagus demonstrate no significant findings. Lungs/Pleura: Stable emphysema. No acute airspace disease, effusion, or pneumothorax. There is bilateral bronchial wall thickening, with scattered areas of mucous plugging within the lower lobe bronchial tree, consistent with bronchitis. Stable 5 mm subpleural right middle lobe nodule image 92/5. Upper Abdomen: No acute abnormality. Musculoskeletal: No acute or destructive bony abnormalities. Reconstructed images demonstrate no additional findings. Review of the MIP images confirms the above findings. IMPRESSION: 1. No evidence of pulmonary embolus. 2. Emphysema, with bilateral bronchial wall thickening greatest in the lower lobes consistent with superimposed bronchitis. 3. Stable 5 mm subpleural right middle lobe nodule, likely benign. Continued routine surveillance with low-dose lung cancer screening CT as recommended on prior 07/13/2022 exam. 4. Aortic Atherosclerosis (ICD10-I70.0). Coronary artery atherosclerosis. Electronically Signed   By: Sharlet Salina M.D.   On: 09/24/2022 19:44   DG Chest Port 1 View  Result Date: 09/24/2022 CLINICAL DATA:  Shortness of breath.  History of COPD emphysema. EXAM: PORTABLE CHEST 1 VIEW COMPARISON:  Chest radiographs 04/04/2018 and 07/24/2015 FINDINGS: Cardiac silhouette and mediastinal contours are within limits. There is again flattening of the diaphragms and moderate hyperinflation. There again lucencies within the upper lungs with attenuation of the pulmonary vasculature in keeping  with chronic emphysematous changes. No acute airspace opacity. No pleural effusion pneumothorax. No acute skeletal abnormality. IMPRESSION: 1. No acute cardiopulmonary process. 2. Chronic emphysematous changes. Electronically Signed   By: Neita Garnet M.D.   On: 09/24/2022 16:39     Data Reviewed: Relevant notes from primary care and specialist visits, past discharge summaries as available in EHR, including Care Everywhere. Prior diagnostic testing as pertinent to current admission diagnoses Updated medications and problem lists for reconciliation ED course, including vitals, labs, imaging, treatment and response to treatment Triage notes, nursing and pharmacy notes and ED provider's notes Notable results as noted in HPI Assessment and Plan: * Acute respiratory failure with hypoxia (HCC) 2/2 to COPD exacerbation.  Continuous supplemental oxygen.  Decreased pedal pulses Pt has decreased pedal pulse at 1+ BL in right foot compared to left foot. We will get arterial doppler and evaluate and manage.   Smoking Nicotine patch.   Hypertension Vitals:   09/24/22 1535 09/24/22 1536 09/24/22 1635 09/24/22 1700  BP: 122/86 122/86 119/75 (!) 141/79   09/24/22 2013 09/24/22 2110 09/24/22 2310  BP: 113/63 (!) 157/71 121/74  Continue amlodipine and PRN hydralazine.    Acute exacerbation of chronic obstructive pulmonary disease (COPD) (HCC) Continue with ventolin/ duoneb and steroids.  Continue solumedrol and spiriva.   DVT prophylaxis:  Heparin   Consults:  None   Advance Care Planning:    Code Status: Full Code  Family Communication:  None  Disposition Plan:  Back to previous home environment  Severity of Illness: The appropriate patient status for this patient is INPATIENT. Inpatient status is judged to be reasonable and necessary in order to provide the required intensity of service to ensure the patient's safety.  The patient's presenting symptoms, physical exam findings, and  initial radiographic and laboratory data in the context of their chronic comorbidities is felt to place them at high risk for further clinical deterioration. Furthermore, it is not anticipated that the patient will be medically stable for discharge from the hospital within 2 midnights of admission.   * I certify that at the point of admission it is my clinical judgment that the patient will require inpatient hospital care spanning beyond 2 midnights from the point of admission due to high intensity of service, high risk for further deterioration and high frequency of surveillance required.*  Author: Gertha Calkin, MD 09/25/2022 1:17 AM  For on call review [email or web address].

## 2022-09-24 NOTE — ED Notes (Signed)
PT. Requesting food and drink. Dr. Cyril Loosen ok'd pt. To eat. Pt. Provided [place] sandwich tray and ice water. Pt. Verbalizes gratitude.

## 2022-09-25 ENCOUNTER — Inpatient Hospital Stay: Payer: Medicaid Other

## 2022-09-25 DIAGNOSIS — R0989 Other specified symptoms and signs involving the circulatory and respiratory systems: Secondary | ICD-10-CM | POA: Diagnosis present

## 2022-09-25 DIAGNOSIS — J9601 Acute respiratory failure with hypoxia: Secondary | ICD-10-CM | POA: Diagnosis not present

## 2022-09-25 LAB — HIV ANTIBODY (ROUTINE TESTING W REFLEX): HIV Screen 4th Generation wRfx: NONREACTIVE

## 2022-09-25 MED ORDER — DOCUSATE SODIUM 100 MG PO CAPS
100.0000 mg | ORAL_CAPSULE | Freq: Every day | ORAL | Status: DC | PRN
  Administered 2022-09-25 – 2022-09-30 (×2): 100 mg via ORAL
  Filled 2022-09-25 (×2): qty 1

## 2022-09-25 MED ORDER — SODIUM CHLORIDE 0.9 % IV SOLN
2.0000 g | INTRAVENOUS | Status: AC
  Administered 2022-09-25 – 2022-09-27 (×3): 2 g via INTRAVENOUS
  Filled 2022-09-25 (×4): qty 20

## 2022-09-25 MED ORDER — POLYETHYLENE GLYCOL 3350 17 G PO PACK
17.0000 g | PACK | Freq: Every day | ORAL | Status: DC | PRN
  Administered 2022-09-25 – 2022-09-29 (×2): 17 g via ORAL
  Filled 2022-09-25 (×2): qty 1

## 2022-09-25 MED ORDER — CLONAZEPAM 0.25 MG PO TBDP: 0.5000 mg | ORAL_TABLET | Freq: Two times a day (BID) | ORAL | Status: DC | PRN

## 2022-09-25 MED ORDER — LORAZEPAM 2 MG/ML PO CONC
2.0000 mg | Freq: Once | ORAL | Status: AC
  Administered 2022-09-25: 2 mg via ORAL
  Filled 2022-09-25: qty 1

## 2022-09-25 MED ORDER — ENOXAPARIN SODIUM 40 MG/0.4ML IJ SOSY
40.0000 mg | PREFILLED_SYRINGE | INTRAMUSCULAR | Status: DC
  Administered 2022-09-26 – 2022-10-03 (×8): 40 mg via SUBCUTANEOUS
  Filled 2022-09-25 (×8): qty 0.4

## 2022-09-25 MED ORDER — IBUPROFEN 400 MG PO TABS
800.0000 mg | ORAL_TABLET | Freq: Once | ORAL | Status: AC
  Administered 2022-09-25: 800 mg via ORAL
  Filled 2022-09-25: qty 2

## 2022-09-25 MED ORDER — IBUPROFEN 400 MG PO TABS: 800.0000 mg | ORAL_TABLET | Freq: Four times a day (QID) | ORAL | Status: DC | PRN

## 2022-09-25 NOTE — Assessment & Plan Note (Signed)
Continue with ventolin/ duoneb and steroids.  Continue solumedrol and spiriva.

## 2022-09-25 NOTE — Progress Notes (Signed)
SATURATION QUALIFICATIONS: (This note is used to comply with regulatory documentation for home oxygen)  Patient Saturations on Room Air at Rest = 88%  Patient Saturations on Room Air while Ambulating = 84%  Patient Saturations on 3 Liters of oxygen while Ambulating = 88-90%  Please briefly explain why patient needs home oxygen: Patient currently requires O2 to maintain safe spO2 levels while mobilizing.   Bruce Harding, PT, DPT Physical Therapist - [place]  [facility]

## 2022-09-25 NOTE — Assessment & Plan Note (Signed)
Vitals:   09/24/22 1535 09/24/22 1536 09/24/22 1635 09/24/22 1700  BP: 122/86 122/86 119/75 (!) 141/79   09/24/22 2013 09/24/22 2110 09/24/22 2310  BP: 113/63 (!) 157/71 121/74  Continue amlodipine and PRN hydralazine.

## 2022-09-25 NOTE — Evaluation (Signed)
Physical Therapy Evaluation & Discharge  Patient Details Name: Bruce Harding MRN: [id number] DOB: 05-22-1960 Today's Date: 09/25/2022  History of Present Illness  62 y/o male presented to ED on 09/24/22 for SOB. Recent R carpal tunnel release on 7/31. Admitted for acute respiratory failure with hypoxia 2/2 COPD exacerbation.PMH: HTN, TIA, COPD, hx of PE, tobacco abuse  Clinical Impression  Patient admitted with the above. PTA, patient lives with wife and is typically independent with use of cane. Currently unable to use cane due to recent R carpal tunnel surgery on 7/31. Patient is functioning at modI level with no AD. Requires 3L O2 during short mobility to maintain spO2 >88%. Complaining of increased SOB with minimal activity. No further skilled PT needs identified acutely. PT will complete orders.       If plan is discharge home, recommend the following: Assist for transportation   Can travel by private vehicle        Equipment Recommendations None recommended by PT  Recommendations for Other Services       Functional Status Assessment Patient has had a recent decline in their functional status and demonstrates the ability to make significant improvements in function in a reasonable and predictable amount of time.     Precautions / Restrictions Precautions Precautions: None Restrictions Weight Bearing Restrictions: No      Mobility  Bed Mobility Overal bed mobility: Modified Independent                  Transfers Overall transfer level: Modified independent                      Ambulation/Gait Ambulation/Gait assistance: Modified independent (Device/Increase time) Gait Distance (Feet): 40 Feet Assistive device: None Gait Pattern/deviations: WFL(Within Functional Limits)          Stairs            Wheelchair Mobility     Tilt Bed    Modified Rankin (Stroke Patients Only)       Balance Overall balance assessment: No apparent  balance deficits (not formally assessed)                                           Pertinent Vitals/Pain Pain Assessment Pain Assessment: Faces Faces Pain Scale: Hurts whole lot Pain Location: R hand Pain Descriptors / Indicators: Grimacing, Guarding, Discomfort Pain Intervention(s): Monitored during session, Limited activity within patient's tolerance, Repositioned    Home Living Family/patient expects to be discharged to:: Private residence Living Arrangements: Spouse/significant other Available Help at Discharge: Family Type of Home: House Home Access: Stairs to enter;Ramped entrance   Entrance Stairs-Number of Steps: 1   Home Layout: One level Home Equipment: Cane - single point      Prior Function Prior Level of Function : Independent/Modified Independent;Driving                     Hand Dominance        Extremity/Trunk Assessment   Upper Extremity Assessment Upper Extremity Assessment: RUE deficits/detail RUE Deficits / Details: immobilized from recent carpal tunnel surgry on 7/31    Lower Extremity Assessment Lower Extremity Assessment: Generalized weakness       Communication   Communication: No difficulties  Cognition Arousal/Alertness: Awake/alert Behavior During Therapy: WFL for tasks assessed/performed Overall Cognitive Status: Within Functional Limits for tasks assessed  General Comments      Exercises     Assessment/Plan    PT Assessment Patient does not need any further PT services  PT Problem List         PT Treatment Interventions      PT Goals (Current goals can be found in the Care Plan section)  Acute Rehab PT Goals Patient Stated Goal: to get oxygen to live PT Goal Formulation: All assessment and education complete, DC therapy    Frequency       Co-evaluation               AM-PAC PT "6 Clicks" Mobility  Outcome Measure Help  needed turning from your back to your side while in a flat bed without using bedrails?: None Help needed moving from lying on your back to sitting on the side of a flat bed without using bedrails?: None Help needed moving to and from a bed to a chair (including a wheelchair)?: None Help needed standing up from a chair using your arms (e.g., wheelchair or bedside chair)?: None Help needed to walk in hospital room?: None Help needed climbing 3-5 steps with a railing? : None 6 Click Score: 24    End of Session Equipment Utilized During Treatment: Oxygen Activity Tolerance: Patient tolerated treatment well Patient left: in bed;with call bell/phone within reach;with family/visitor present Nurse Communication: Mobility status PT Visit Diagnosis: Muscle weakness (generalized) (M62.81)    Time: 8119-1478 PT Time Calculation (min) (ACUTE ONLY): 25 min   Charges:   PT Evaluation $PT Eval Low Complexity: 1 Low PT Treatments $Therapeutic Activity: 8-22 mins PT General Charges $$ ACUTE PT VISIT: 1 Visit         Maylon Peppers, PT, DPT Physical Therapist - [facility]  [facility]    A  09/25/2022, 9:49 AM

## 2022-09-25 NOTE — Assessment & Plan Note (Signed)
Pt has decreased pedal pulse at 1+ BL in right foot compared to left foot. We will get arterial doppler and evaluate and manage.

## 2022-09-25 NOTE — Plan of Care (Signed)

## 2022-09-25 NOTE — Progress Notes (Signed)
  Progress Note   Patient: Bruce Harding [id number] DOB: February 15, 1961 DOA: 09/24/2022     1 DOS: the patient was seen and examined on 09/25/2022   Brief hospital course:  62 y.o. male with medical history significant for hypertension, TIA, COPD PE, tobacco abuse  and carpal tunnel release surgery 2 days prior who presented with complaints of shortness of breath.  Patient denied associated fevers, chills, hemoptysis, nausea vomiting.  Symptoms associated with cough  and exertional dypnea. Patient did receive Solu-Medrol, bronchodilators  and antibiotics. He remains on oxygen supplementation due to exertional dyspnea  Assessment and Plan: Acute respiratory failure with hypoxia (HCC) Secondary to Emphysema/COPD exacerbation. Optimize with Solumedrol,  Bronchodilators and antibiotics. Currently requiring oxygen.Incentive spirometry/flutter valve Continuous supplemental oxygen.Weaning protocol in place. At this time, patient will  require oxygen .Walk test prior to discharge  Right Carpal tunnel release:  Post Op day #3. Patient is requiring analgesics with ibuprofen and percocet for pain control. Patient reports chronically being on medicationf ro chronic pain.  Incidental findings of 5mm Right lung nodule: further follow up as outpatient  Decreased pedal pulses: suspected PAD Arterial doppler ordered and pending  Smoking Nicotine patch was ordered.Cessation counseling was also offered.  Hypertension:  BP stable on current regimen.   Subjective: Patient complains of pain in his right wrist.He was ambulated with PT and noted to desaturate on 3L/min of O2  Physical Exam: Vitals:   09/25/22 0217 09/25/22 0346 09/25/22 0753 09/25/22 0813  BP:  130/72  (!) 152/79  Pulse:  77 79 84  Resp:  20 20 20   Temp:  97.7 F (36.5 C)  98.2 F (36.8 C)  TempSrc:  Oral    SpO2: (!) 87% 100% 94% 97%  Weight:      Height:       General: Patient doing clinically better HEENT: Oral mucosa  moist.Neck supple CHEST: Diminished breath sounds with expiratory wheezes CVS:S1,S2 wnm or gallops ABDOMEN:soft non tender EXT: No pedal edema or swelling appreciated Skin : Negative for rash  Data Reviewed: CT chest without evidence of PE Emphysema appreciated with superimposed bronchitis Stable 5mm right middle lobe nodule  Family Communication: Wife at bedside was updated   Disposition: Status is: Inpatient Remains inpatient appropriate because: Hypoxia  Planned Discharge Destination: Home  Time spent: 32 minutes  Author: Lilia Pro, MD 09/25/2022 12:31 PM  For on call review [email or web address].

## 2022-09-25 NOTE — Assessment & Plan Note (Signed)
2/2 to COPD exacerbation.  Continuous supplemental oxygen.

## 2022-09-25 NOTE — Assessment & Plan Note (Signed)
-  Nicotine patch 

## 2022-09-26 ENCOUNTER — Inpatient Hospital Stay: Payer: Medicaid Other

## 2022-09-26 DIAGNOSIS — J441 Chronic obstructive pulmonary disease with (acute) exacerbation: Secondary | ICD-10-CM | POA: Diagnosis not present

## 2022-09-26 DIAGNOSIS — J9601 Acute respiratory failure with hypoxia: Secondary | ICD-10-CM | POA: Diagnosis not present

## 2022-09-26 LAB — GLUCOSE, CAPILLARY
Glucose-Capillary: 161 mg/dL — ABNORMAL HIGH (ref 70–99)
Glucose-Capillary: 206 mg/dL — ABNORMAL HIGH (ref 70–99)
Glucose-Capillary: 130 mg/dL — ABNORMAL HIGH (ref 70–99)

## 2022-09-26 LAB — RESPIRATORY PANEL BY PCR

## 2022-09-26 LAB — MRSA NEXT GEN BY PCR, NASAL: MRSA by PCR Next Gen: NOT DETECTED

## 2022-09-26 LAB — BLOOD GAS, ARTERIAL
Acid-Base Excess: 6.7 mmol/L — ABNORMAL HIGH (ref 0.0–2.0)
Bicarbonate: 33.3 mmol/L — ABNORMAL HIGH (ref 20.0–28.0)
FIO2: 40 %
O2 Saturation: 98.6 %
Patient temperature: 37
pCO2 arterial: 55 mmHg — ABNORMAL HIGH (ref 32–48)
pH, Arterial: 7.39 (ref 7.35–7.45)
pO2, Arterial: 97 mmHg (ref 83–108)

## 2022-09-26 LAB — SARS CORONAVIRUS 2 BY RT PCR: SARS Coronavirus 2 by RT PCR: NEGATIVE

## 2022-09-26 MED ORDER — OXYCODONE HCL 5 MG PO TABS
5.0000 mg | ORAL_TABLET | Freq: Once | ORAL | Status: AC
  Administered 2022-09-26: 5 mg via ORAL

## 2022-09-26 MED ORDER — METHYLPREDNISOLONE SODIUM SUCC 40 MG IJ SOLR
40.0000 mg | INTRAMUSCULAR | Status: AC
  Administered 2022-09-30 – 2022-10-02 (×3): 40 mg via INTRAVENOUS
  Filled 2022-09-26 (×3): qty 1

## 2022-09-26 MED ORDER — ORAL CARE MOUTH RINSE: 15.0000 mL | OROMUCOSAL | Status: DC | PRN

## 2022-09-26 MED ORDER — CLONAZEPAM 0.5 MG PO TBDP
0.5000 mg | ORAL_TABLET | Freq: Two times a day (BID) | ORAL | Status: DC | PRN
  Administered 2022-09-26 – 2022-09-27 (×3): 0.5 mg via ORAL
  Filled 2022-09-26: qty 2
  Filled 2022-09-26 (×2): qty 1

## 2022-09-26 MED ORDER — GUAIFENESIN ER 600 MG PO TB12
600.0000 mg | ORAL_TABLET | Freq: Four times a day (QID) | ORAL | Status: DC | PRN
  Administered 2022-09-26 – 2022-09-28 (×4): 600 mg via ORAL
  Filled 2022-09-26 (×4): qty 1

## 2022-09-26 MED ORDER — METHYLPREDNISOLONE SODIUM SUCC 40 MG IJ SOLR
INTRAMUSCULAR | Status: AC
  Filled 2022-09-26: qty 1

## 2022-09-26 MED ORDER — CHLORHEXIDINE GLUCONATE CLOTH 2 % EX PADS: 6.0000 | MEDICATED_PAD | Freq: Every day | CUTANEOUS | Status: DC

## 2022-09-26 MED ORDER — INSULIN ASPART 100 UNIT/ML IJ SOLN: 0.0000 [IU] | Freq: Every day | INTRAMUSCULAR | Status: DC

## 2022-09-26 MED ORDER — CHLORHEXIDINE GLUCONATE CLOTH 2 % EX PADS
6.0000 | MEDICATED_PAD | Freq: Every day | CUTANEOUS | Status: DC
  Administered 2022-09-27 – 2022-10-01 (×4): 6 via TOPICAL

## 2022-09-26 MED ORDER — ORAL CARE MOUTH RINSE
15.0000 mL | OROMUCOSAL | Status: DC
  Administered 2022-09-26 – 2022-10-03 (×15): 15 mL via OROMUCOSAL

## 2022-09-26 MED ORDER — IPRATROPIUM-ALBUTEROL 0.5-2.5 (3) MG/3ML IN SOLN
3.0000 mL | RESPIRATORY_TRACT | Status: DC
  Administered 2022-09-26 – 2022-10-02 (×35): 3 mL via RESPIRATORY_TRACT
  Filled 2022-09-26 (×35): qty 3

## 2022-09-26 MED ORDER — MAGNESIUM SULFATE 2 GM/50ML IV SOLN
2.0000 g | Freq: Once | INTRAVENOUS | Status: AC
  Administered 2022-09-26: 2 g via INTRAVENOUS
  Filled 2022-09-26: qty 50

## 2022-09-26 MED ORDER — OXYCODONE HCL 5 MG PO TABS
10.0000 mg | ORAL_TABLET | Freq: Three times a day (TID) | ORAL | Status: DC | PRN
  Administered 2022-09-26: 10 mg via ORAL
  Filled 2022-09-26 (×2): qty 2

## 2022-09-26 MED ORDER — BENZONATATE 100 MG PO CAPS
200.0000 mg | ORAL_CAPSULE | Freq: Three times a day (TID) | ORAL | Status: DC | PRN
  Administered 2022-09-26 – 2022-09-27 (×3): 200 mg via ORAL
  Filled 2022-09-26 (×3): qty 2

## 2022-09-26 MED ORDER — PREDNISONE 20 MG PO TABS: 20.0000 mg | ORAL_TABLET | Freq: Every day | ORAL | Status: DC

## 2022-09-26 MED ORDER — NALOXONE HCL 0.4 MG/ML IJ SOLN
INTRAMUSCULAR | Status: AC
  Filled 2022-09-26: qty 1

## 2022-09-26 MED ORDER — SODIUM CHLORIDE 0.9 % IV SOLN
500.0000 mg | INTRAVENOUS | Status: AC
  Administered 2022-09-26 – 2022-09-28 (×3): 500 mg via INTRAVENOUS
  Filled 2022-09-26 (×5): qty 5

## 2022-09-26 MED ORDER — METHYLPREDNISOLONE SODIUM SUCC 40 MG IJ SOLR
40.0000 mg | Freq: Two times a day (BID) | INTRAMUSCULAR | Status: AC
  Administered 2022-09-27 – 2022-09-29 (×6): 40 mg via INTRAVENOUS
  Filled 2022-09-26 (×6): qty 1

## 2022-09-26 MED ORDER — METHYLPREDNISOLONE SODIUM SUCC 125 MG IJ SOLR
125.0000 mg | Freq: Once | INTRAMUSCULAR | Status: AC
  Administered 2022-09-26: 125 mg via INTRAVENOUS
  Filled 2022-09-26: qty 2

## 2022-09-26 MED ORDER — OXYCODONE HCL 5 MG PO TABS
10.0000 mg | ORAL_TABLET | Freq: Once | ORAL | Status: AC
  Administered 2022-09-26: 10 mg via ORAL
  Filled 2022-09-26: qty 2

## 2022-09-26 MED ORDER — INSULIN ASPART 100 UNIT/ML IJ SOLN
0.0000 [IU] | Freq: Three times a day (TID) | INTRAMUSCULAR | Status: DC
  Administered 2022-09-27: 3 [IU] via SUBCUTANEOUS
  Administered 2022-09-28 (×2): 2 [IU] via SUBCUTANEOUS
  Administered 2022-09-28 – 2022-09-29 (×2): 3 [IU] via SUBCUTANEOUS
  Administered 2022-09-30: 2 [IU] via SUBCUTANEOUS
  Administered 2022-09-30 – 2022-10-02 (×4): 3 [IU] via SUBCUTANEOUS
  Administered 2022-10-02: 2 [IU] via SUBCUTANEOUS
  Filled 2022-09-26 (×13): qty 1

## 2022-09-26 MED ORDER — PREDNISONE 10 MG PO TABS: 10.0000 mg | ORAL_TABLET | Freq: Every day | ORAL | Status: DC

## 2022-09-26 MED ORDER — PREDNISONE 20 MG PO TABS
30.0000 mg | ORAL_TABLET | Freq: Every day | ORAL | Status: DC
  Administered 2022-10-03: 30 mg via ORAL
  Filled 2022-09-26: qty 1

## 2022-09-26 NOTE — Significant Event (Signed)
Rapid Response Event Note   Reason for Call : RRT otified for decreased 02 sats, pt "passed out"   Initial Focused Assessment: laying in bed, drowsy, wheezing noted      Interventions:Dr Lucianne Muss at bedside to examine, stat ABG, EKG, Duo nebs reordered. (Noted the family might have given some home medication) pt also received a dose of narcan that he immediately roused and was alert, responsive, and aggitated.    Plan of Care: as above, RN Earney Mallet, and Charge RN Matt to call if further needed.    Event Summary: as above  MD Notified: Dr Kumar [id number] Call Time:1348 Arrival Time:1349 End Time:1410  , A, RN

## 2022-09-26 NOTE — Plan of Care (Signed)

## 2022-09-26 NOTE — Progress Notes (Signed)
   09/26/22 1350  Spiritual Encounters  Type of Visit Initial  Care provided to: Family  Referral source Code page  Reason for visit Code  OnCall Visit Yes   Chaplain responded to Rapid Response Code and encountered family waiting in hall. Chaplain remained present until they returned to room.

## 2022-09-26 NOTE — Progress Notes (Signed)
Patient transported to ICU from room 241 on the BIPAP. No issues with tranpsort.

## 2022-09-26 NOTE — Progress Notes (Signed)
  Progress Note   Patient: Bruce Harding UUV:[id number] DOB: [birth year] DOA: 09/24/2022     2 DOS: the patient was seen and examined on 09/26/2022   Brief hospital course:  62 y.o. male with medical history significant for hypertension, TIA, COPD PE, tobacco abuse  and carpal tunnel release surgery 2 days prior who presented with complaints of shortness of breath.  Patient denied associated fevers, chills, hemoptysis, nausea vomiting.  Symptoms associated with cough  and exertional dypnea. Patient did receive Solu-Medrol, bronchodilators  and antibiotics. He remains on oxygen supplementation due to exertional dyspnea  Assessment and Plan:  Acute respiratory failure with hypoxia (HCC) Secondary to Emphysema/COPD exacerbation. Optimize with Solumedrol,  Bronchodilators and antibiotics. Currently requiring oxygen.Incentive spirometry/flutter valve Continuous supplemental oxygen.Weaning protocol in place. At this time, patient will  require oxygen .Walk test prior to discharge -Added cough suppression with Robitussin and Tessalon Perles.  Right Carpal tunnel release:  Post Op day #3. Patient is requiring analgesics with ibuprofen and percocet for pain control. Patient reports chronically being on medicationf ro chronic pain.  Incidental findings of 5mm Right lung nodule: further follow up as outpatient  Decreased pedal pulses: suspected PAD Arterial doppler ordered and pending  Smoking Nicotine patch was ordered.Cessation counseling was also offered.  Hypertension:  BP stable on current regimen.  Subjective: Patient complains of rough night complaining of pain in his wrist.  Continues to complain of wrist pain.  Breathing still the same patient continues to stay on 4 L via nasal cannula.  Physical Exam: Vitals:   09/26/22 0630 09/26/22 0742 09/26/22 0926 09/26/22 1126  BP:   131/77 (!) 155/86  Pulse:   78 (!) 102  Resp:   17 16  Temp:   (!) 97.5 F (36.4 C) 97.6 F (36.4 C)   TempSrc:   Oral Oral  SpO2: 95% 100% 100% 93%  Weight:      Height:       General: Patient doing clinically better HEENT: Oral mucosa moist.Neck supple CHEST: Diminished breath sounds with expiratory wheezes bilaterally coughing at the time of examination. CVS:S1,S2 wnm or gallops ABDOMEN:soft non tender EXT: No pedal edema or swelling appreciated Skin : Negative for rash  Data Reviewed: CT chest without evidence of PE Emphysema appreciated with superimposed bronchitis Stable 5mm right middle lobe nodule  Family Communication: Wife at bedside was updated  Disposition: Status is: Inpatient Remains inpatient appropriate because: Hypoxia  Planned Discharge Destination: Home  Time spent: 32 minutes  Author: Kirstie Peri, MD 09/26/2022 12:00 PM  For on call review [email or web address].

## 2022-09-26 NOTE — Consult Note (Signed)
NAME:  Bruce Harding, MRN:  [id number], DOB:  1960-04-19, LOS: 2 ADMISSION DATE:  09/24/2022, CONSULTATION DATE:  09/26/2022 REFERRING MD:  Kirstie Peri, MD, CHIEF COMPLAINT:  COPD Exacerbation   History of Present Illness:   Patient is a pleasant 62 year old male with a past medical history of COPD presenting to the hospital with a COPD exacerbation. He's had acute worsening of his respiratory status prompting a consult to critical care.  Patient was admitted on 09/24/2022 with increased shortness of breath and wheezing, concerning for COPD exacerbation. Patient was smoking up until presentation. They report he underwent carpel tunnel repair on 09/23/2022, which appears to have triggered his exacerbation.  In the ED, the patient had a CXR and CT chest (-ve for PE, notable for bronchial wall thickening). He received steroids, antibiotics, and duo-nebs and was admitted to the hospital ward. Today, he has had a rapid response called secondary to increased drowsiness. Patient was given duo-nebs and narcan with improvement in oxygenation and mental status. Patient's family adamently deny giving the patient any extra medications or narcotics and report having given him a nebulizer that was hanging in the room. ABG performed showed a pH of 7.39, CO2 of 55, and PaO2 of 97. Patient was started on BiPAP for work of breathing and critical care consult requested.  Imaging independently reviewed. CT chest 8/1 is notable for significant centrilobular emphysema and bronchial wall thickening. CXR today 8/3 reviewed showing flattening of the diaphragm consistent with airtrapping and no sign of pneumothorax.  Pertinent  Medical History  -HTN -TIA -COPD -tobacco use  Objective   Blood pressure (!) 165/81, pulse (!) 108, temperature 97.6 F (36.4 C), temperature source Oral, resp. rate 16, height 5\' 5"  (1.651 m), weight 66.7 kg, SpO2 98%.    FiO2 (%):  [32 %-50 %] 50 %   Intake/Output Summary (Last 24 hours)  at 09/26/2022 1518 Last data filed at 09/26/2022 1300 Gross per 24 hour  Intake 860.98 ml  Output 1150 ml  Net -289.02 ml   Filed Weights   09/24/22 1537  Weight: 66.7 kg    Examination: Physical Exam Constitutional:      Appearance: He is ill-appearing.  HENT:     Mouth/Throat:     Mouth: Mucous membranes are moist.  Cardiovascular:     Rate and Rhythm: Normal rate and regular rhythm.  Pulmonary:     Effort: Respiratory distress present.     Breath sounds: Wheezing present. No rales.  Neurological:     General: No focal deficit present.     Mental Status: He is alert and oriented to person, place, and time. Mental status is at baseline.     Assessment & Plan:   #COPD Exacerbation #Acute Hypoxic Respiratory Failure  Patient with a known history of very severe COPD (FEV1 0.71L, 28% predicted, 03/2022), presenting for signs and symptoms consistent with a COPD exacerbation. On exam, he is diffusely wheezing with overall diminished breath sounds. Patient had a rapid response called earlier secondary to depressed mental status that improved with oxygen and narcan. Another rapid response called secondary to work of breath.  He was started on BiPAP that I recommend be continued. Patient received IV solumedrol today, and will be started on 40 mg bid followed by a taper which is appropriate. Patient would benefit from switching to standing nebulizer therapy every 4 hours given severity of exacerbation and need for BiPAP. Antibiotics (Ceftriaxone and Azithromycin) started for empiric coverage.  Recommend sending a  full respiratory viral panel and SARS-CoV-2 PCR.  -continue IV steroids, 40 mg of methylprednisolone twice daily -continue CTX/Azithromycin -duonebs every 4 hours -continue BiPAP -goal SpO2 > 88% -smoking cessation imperative  Labs   CBC: Recent Labs  Lab 09/24/22 1537 09/25/22 0621 09/26/22 0443  WBC 16.3* 8.9 16.8*  NEUTROABS  --   --  14.0*  HGB 13.9 13.9 14.4   HCT 42.0 41.3 44.3  MCV 88.8 88.1 90.2  PLT 240 215 239    Basic Metabolic Panel: Recent Labs  Lab 09/24/22 1537 09/25/22 0621 09/26/22 0443  NA 136 136 137  K 3.6 4.3 4.3  CL 101 101 101  CO2 23 27 28   GLUCOSE 118* 159* 112*  BUN 12 15 18   CREATININE 0.90 0.93 0.72  CALCIUM 8.7* 8.7* 8.8*   GFR: Estimated Creatinine Clearance: 82.2 mL/min (by C-G formula based on SCr of 0.72 mg/dL). Recent Labs  Lab 09/24/22 1537 09/25/22 0621 09/26/22 0443  WBC 16.3* 8.9 16.8*    Liver Function Tests: Recent Labs  Lab 09/24/22 1537 09/25/22 0621  AST 16 18  ALT 12 12  ALKPHOS 66 67  BILITOT 0.8 0.4  PROT 7.1 6.8  ALBUMIN 3.7 3.5   No results for input(s): "LIPASE", "AMYLASE" in the last 168 hours. No results for input(s): "AMMONIA" in the last 168 hours.  ABG    Component Value Date/Time   PHART 7.39 09/26/2022 1356   PCO2ART 55 (H) 09/26/2022 1356   PO2ART 97 09/26/2022 1356   HCO3 33.3 (H) 09/26/2022 1356   O2SAT 98.6 09/26/2022 1356     Coagulation Profile: No results for input(s): "INR", "PROTIME" in the last 168 hours.  Cardiac Enzymes: No results for input(s): "CKTOTAL", "CKMB", "CKMBINDEX", "TROPONINI" in the last 168 hours.  HbA1C: No results found for: "HGBA1C"  CBG: Recent Labs  Lab 09/26/22 1350  GLUCAP 161*    Review of Systems:   Unable to obtain  Past Medical History:  He,  has a past medical history of Allergic rhinitis, Anxiety, Arthritis, Asthma, Chronic back pain, COPD (chronic obstructive pulmonary disease) (HCC), Depression, Dyspnea, Hyperlipidemia, Hypertension, Migraine headache, Right leg numbness, Sleep apnea, and TIA (transient ischemic attack) (2015).   Surgical History:   Past Surgical History:  Procedure Laterality Date   CARPAL TUNNEL RELEASE     CARPAL TUNNEL RELEASE Right 10/13/2017   Procedure: CARPAL TUNNEL RELEASE;  Surgeon: Christena Flake, MD;  Location: [facility];  Service: Orthopedics;  Laterality:  Right;   CARPAL TUNNEL RELEASE Right 09/23/2022   Procedure: REVISION OPEN RIGHT CARPAL TUNNEL RELEASE;  Surgeon: Christena Flake, MD;  Location: [facility];  Service: Orthopedics;  Laterality: Right;   GANGLION CYST EXCISION Right 10/13/2017   Procedure: REMOVAL GANGLION OF WRIST;  Surgeon: Christena Flake, MD;  Location: [facility];  Service: Orthopedics;  Laterality: Right;   KNEE SURGERY     LAMINECTOMY     LUMBAR DISC SURGERY       Social History:   reports that he quit smoking about 5 years ago. His smoking use included cigarettes. He started smoking about 60 years ago. He has a 55 pack-year smoking history. He has never used smokeless tobacco. He reports that he does not drink alcohol and does not use drugs.   Family History:  His family history is not on file.   Allergies Allergies  Allergen Reactions   Amoxicillin Diarrhea   Morphine Nausea And Vomiting    Morphine Analogues  Home Medications  Prior to Admission medications   Medication Sig Start Date End Date Taking? Authorizing Provider  albuterol (PROVENTIL HFA;VENTOLIN HFA) 108 (90 Base) MCG/ACT inhaler Inhale 2 puffs into the lungs every 6 (six) hours as needed for wheezing or shortness of breath. 04/04/18  Yes Veronese, [place], MD  amLODipine (NORVASC) 2.5 MG tablet Take 1 tablet by mouth at bedtime. 06/26/15  Yes [provider]  fluticasone-salmeterol (ADVAIR) 500-50 MCG/ACT AEPB Inhale 1 puff into the lungs in the morning and at bedtime.   Yes [provider]  gabapentin (NEURONTIN) 300 MG capsule Take 600 mg by mouth 3 (three) times daily.   Yes [provider]  ibuprofen (ADVIL) 800 MG tablet Take 1 tablet (800 mg total) by mouth every 8 (eight) hours as needed for mild pain or moderate pain. 09/23/22  Yes Poggi, Excell Seltzer, MD  oxyCODONE (ROXICODONE) 5 MG immediate release tablet Take 1-2 tablets (5-10 mg total) by mouth every 6 (six) hours as needed for breakthrough pain. 09/23/22  Yes  Poggi, Excell Seltzer, MD  oxyCODONE-acetaminophen (PERCOCET) 10-325 MG tablet Take 1 tablet by mouth every 6 (six) hours as needed. 10/13/17  Yes Poggi, Excell Seltzer, MD  senna (SENOKOT) 8.6 MG tablet Take 1 tablet by mouth daily.   Yes [provider]  tiotropium (SPIRIVA) 18 MCG inhalation capsule Place 18 mcg into inhaler and inhale daily.   Yes [provider]  aspirin 81 MG chewable tablet Chew 81 mg by mouth daily. Patient not taking: Reported on 09/24/2022    [provider]  DULoxetine (CYMBALTA) 30 MG capsule Take 1 capsule by mouth 2 (two) times daily. Patient not taking: Reported on 09/24/2022 06/26/15   [provider]  montelukast (SINGULAIR) 10 MG tablet Take 10 mg by mouth at bedtime. Patient not taking: Reported on 09/24/2022    [provider]     Critical care time: 40 minutes    Raechel Chute, MD [facility] 09/26/2022 3:37 PM

## 2022-09-27 DIAGNOSIS — J441 Chronic obstructive pulmonary disease with (acute) exacerbation: Secondary | ICD-10-CM | POA: Diagnosis not present

## 2022-09-27 DIAGNOSIS — J9601 Acute respiratory failure with hypoxia: Secondary | ICD-10-CM | POA: Diagnosis not present

## 2022-09-27 LAB — COMPREHENSIVE METABOLIC PANEL WITH GFR
ALT: 19 U/L (ref 0–44)
AST: 20 U/L (ref 15–41)
Albumin: 3.4 g/dL — ABNORMAL LOW (ref 3.5–5.0)
Alkaline Phosphatase: 58 U/L (ref 38–126)
Anion gap: 7 (ref 5–15)
BUN: 19 mg/dL (ref 8–23)
CO2: 29 mmol/L (ref 22–32)
Calcium: 8.7 mg/dL — ABNORMAL LOW (ref 8.9–10.3)
Chloride: 99 mmol/L (ref 98–111)
Creatinine, Ser: 0.82 mg/dL (ref 0.61–1.24)
GFR, Estimated: >60 mL/min (ref >60)
Glucose, Bld: 132 mg/dL — ABNORMAL HIGH (ref 70–99)
Potassium: 4.6 mmol/L (ref 3.5–5.1)
Sodium: 135 mmol/L (ref 135–145)
Total Bilirubin: 0.4 mg/dL (ref 0.3–1.2)
Total Protein: 6.6 g/dL (ref 6.5–8.1)

## 2022-09-27 LAB — CBC WITH DIFFERENTIAL/PLATELET
Abs Immature Granulocytes: 0.06 10*3/uL (ref 0.00–0.07)
Basophils Absolute: 0 10*3/uL (ref 0.0–0.1)
Basophils Relative: 0 %
Eosinophils Absolute: 0 10*3/uL (ref 0.0–0.5)
Eosinophils Relative: 0 %
HCT: 42.9 % (ref 39.0–52.0)
Hemoglobin: 14.6 g/dL (ref 13.0–17.0)
Immature Granulocytes: 1 %
Lymphocytes Relative: 5 %
Lymphs Abs: 0.6 10*3/uL — ABNORMAL LOW (ref 0.7–4.0)
MCH: 30 pg (ref 26.0–34.0)
MCHC: 34 g/dL (ref 30.0–36.0)
MCV: 88.3 fL (ref 80.0–100.0)
Monocytes Absolute: 0.8 10*3/uL (ref 0.1–1.0)
Monocytes Relative: 6 %
Neutro Abs: 10.9 10*3/uL — ABNORMAL HIGH (ref 1.7–7.7)
Neutrophils Relative %: 88 %
Platelets: 227 10*3/uL (ref 150–400)
RBC: 4.86 MIL/uL (ref 4.22–5.81)
RDW: 14 % (ref 11.5–15.5)
WBC: 12.4 10*3/uL — ABNORMAL HIGH (ref 4.0–10.5)
nRBC: 0 % (ref 0.0–0.2)

## 2022-09-27 LAB — HEMOGLOBIN A1C
Hgb A1c MFr Bld: 5.6 % (ref 4.8–5.6)
Mean Plasma Glucose: 114.02 mg/dL

## 2022-09-27 LAB — GLUCOSE, CAPILLARY
Glucose-Capillary: 92 mg/dL (ref 70–99)
Glucose-Capillary: 112 mg/dL — ABNORMAL HIGH (ref 70–99)
Glucose-Capillary: 155 mg/dL — ABNORMAL HIGH (ref 70–99)
Glucose-Capillary: 134 mg/dL — ABNORMAL HIGH (ref 70–99)

## 2022-09-27 LAB — MAGNESIUM: Magnesium: 2.5 mg/dL — ABNORMAL HIGH (ref 1.7–2.4)

## 2022-09-27 MED ORDER — CLONAZEPAM 0.25 MG PO TBDP
0.2500 mg | ORAL_TABLET | Freq: Three times a day (TID) | ORAL | Status: DC | PRN
  Administered 2022-09-27 – 2022-10-01 (×11): 0.25 mg via ORAL
  Filled 2022-09-27 (×4): qty 1
  Filled 2022-09-27: qty 2
  Filled 2022-09-27 (×5): qty 1
  Filled 2022-09-27: qty 2

## 2022-09-27 MED ORDER — GABAPENTIN 300 MG PO CAPS
300.0000 mg | ORAL_CAPSULE | Freq: Three times a day (TID) | ORAL | Status: DC
  Administered 2022-09-27 – 2022-10-03 (×19): 300 mg via ORAL
  Filled 2022-09-27 (×18): qty 1

## 2022-09-27 MED ORDER — OXYCODONE HCL 5 MG PO TABS
10.0000 mg | ORAL_TABLET | Freq: Four times a day (QID) | ORAL | Status: DC | PRN
  Administered 2022-09-27 (×2): 10 mg via ORAL
  Filled 2022-09-27 (×2): qty 2

## 2022-09-27 MED ORDER — CLONAZEPAM 0.5 MG PO TBDP
0.5000 mg | ORAL_TABLET | Freq: Three times a day (TID) | ORAL | Status: DC | PRN
  Administered 2022-09-27: 0.5 mg via ORAL
  Filled 2022-09-27: qty 1

## 2022-09-27 MED ORDER — OXYCODONE HCL 5 MG PO TABS
15.0000 mg | ORAL_TABLET | ORAL | Status: DC | PRN
  Administered 2022-09-27 – 2022-09-28 (×6): 15 mg via ORAL
  Filled 2022-09-27 (×6): qty 3

## 2022-09-27 NOTE — Progress Notes (Signed)
NAME:  Bruce Harding, MRN:  [id number], DOB:  [birth year], LOS: 3 ADMISSION DATE:  09/24/2022,  CHIEF COMPLAINT:  COPD Exacerbation   History of Present Illness:   Patient is a pleasant 62 year old male with a past medical history of COPD presenting to the hospital with a COPD exacerbation. He's had acute worsening of his respiratory status prompting a consult to critical care.   Patient was admitted on 09/24/2022 with increased shortness of breath and wheezing, concerning for COPD exacerbation. Patient was smoking up until presentation. They report he underwent carpel tunnel repair on 09/23/2022, which appears to have triggered his exacerbation.   In the ED, the patient had a CXR and CT chest (-ve for PE, notable for bronchial wall thickening). He received steroids, antibiotics, and duo-nebs and was admitted to the hospital ward. Today, he has had a rapid response called secondary to increased drowsiness. Patient was given duo-nebs and narcan with improvement in oxygenation and mental status. Patient's family adamently deny giving the patient any extra medications or narcotics and report having given him a nebulizer that was hanging in the room. ABG performed showed a pH of 7.39, CO2 of 55, and PaO2 of 97. Patient was started on BiPAP for work of breathing and critical care consult requested.   Imaging independently reviewed. CT chest 8/1 is notable for significant centrilobular emphysema and bronchial wall thickening. CXR today 8/3 reviewed showing flattening of the diaphragm consistent with airtrapping and no sign of pneumothorax.  Pertinent  Medical History  -HTN -TIA -COPD -tobacco use  Significant Hospital Events: Including procedures, antibiotic start and stop dates in addition to other pertinent events   09/26/2022: transferred to ICU for COPD exacerbation 09/27/2022: remains on BiPAP  Interim History / Subjective:   Objective   Blood pressure (!) 139/92, pulse 88, temperature (!) 97.4  F (36.3 C), temperature source Axillary, resp. rate 20, height 5\' 5"  (1.651 m), weight 69.9 kg, SpO2 96%.    FiO2 (%):  [30 %-50 %] 30 %   Intake/Output Summary (Last 24 hours) at 09/27/2022 6295 Last data filed at 09/27/2022 0000 Gross per 24 hour  Intake 872.67 ml  Output 450 ml  Net 422.67 ml   Filed Weights   09/24/22 1537 09/26/22 1536  Weight: 66.7 kg 69.9 kg    Examination: Physical Exam Constitutional:      General: He is not in acute distress.    Appearance: He is ill-appearing.  Pulmonary:     Breath sounds: Wheezing present. No rales.     Comments: Diffuse wheezing in bilateral lung fields. Abdominal:     Palpations: Abdomen is soft.  Musculoskeletal:     Right lower leg: No edema.     Left lower leg: No edema.  Neurological:     General: No focal deficit present.     Mental Status: He is alert and oriented to person, place, and time. Mental status is at baseline.      Assessment & Plan:  #COPD Exacerbation #Acute Hypoxic Respiratory Failure   Patient with a known history of very severe COPD (FEV1 0.71L, 28% predicted, 03/2022), presenting for signs and symptoms consistent with a COPD exacerbation. On exam, he remains diffusely wheezy with overall diminished breath sounds. Patient had rapid responses called yesterday secondary to depressed mental status as well as increased work of breathing. He was started on BiPAP and transferred to the ICU for further management.  He has remained on BiPAP overnight and was unable to tolerate  being off secondary to increased shortness of breath. His chest xray does not show any focal consolidations, and viral panels were negative (full respiratory panel, COVID). He is on IV steroids at 40 mg bid, standing duo-nebs every 4 hours, and IV antibiotics with ceftriaxone and azithromycin. Patient is on gabapentin at 600 mg tid which I would recommend tapering till off given association with severe COPD exacerbations ([id number]).    -continue IV steroids, 40 mg of methylprednisolone twice daily -continue CTX/Azithromycin -duonebs every 4 hours -continue BiPAP, breaks for meals -goal SpO2 > 88% -wean gabapentin, decreased to 300 mg tid today -smoking cessation imperative  Labs   CBC: Recent Labs  Lab 09/24/22 1537 09/25/22 0621 09/26/22 0443 09/27/22 0426  WBC 16.3* 8.9 16.8* 12.4*  NEUTROABS  --   --  14.0* 10.9*  HGB 13.9 13.9 14.4 14.6  HCT 42.0 41.3 44.3 42.9  MCV 88.8 88.1 90.2 88.3  PLT 240 215 239 227    Basic Metabolic Panel: Recent Labs  Lab 09/24/22 1537 09/25/22 0621 09/26/22 0443 09/27/22 0426  NA 136 136 137 135  K 3.6 4.3 4.3 4.6  CL 101 101 101 99  CO2 23 27 28 29   GLUCOSE 118* 159* 112* 132*  BUN 12 15 18 19   CREATININE 0.90 0.93 0.72 0.82  CALCIUM 8.7* 8.7* 8.8* 8.7*  MG  --   --   --  2.5*   GFR: Estimated Creatinine Clearance: 80.2 mL/min (by C-G formula based on SCr of 0.82 mg/dL). Recent Labs  Lab 09/24/22 1537 09/25/22 0621 09/26/22 0443 09/27/22 0426  WBC 16.3* 8.9 16.8* 12.4*    Liver Function Tests: Recent Labs  Lab 09/24/22 1537 09/25/22 0621 09/27/22 0426  AST 16 18 20   ALT 12 12 19   ALKPHOS 66 67 58  BILITOT 0.8 0.4 0.4  PROT 7.1 6.8 6.6  ALBUMIN 3.7 3.5 3.4*   No results for input(s): "LIPASE", "AMYLASE" in the last 168 hours. No results for input(s): "AMMONIA" in the last 168 hours.  ABG    Component Value Date/Time   PHART 7.39 09/26/2022 1356   PCO2ART 55 (H) 09/26/2022 1356   PO2ART 97 09/26/2022 1356   HCO3 33.3 (H) 09/26/2022 1356   O2SAT 98.6 09/26/2022 1356     Coagulation Profile: No results for input(s): "INR", "PROTIME" in the last 168 hours.  Cardiac Enzymes: No results for input(s): "CKTOTAL", "CKMB", "CKMBINDEX", "TROPONINI" in the last 168 hours.  HbA1C: No results found for: "HGBA1C"  CBG: Recent Labs  Lab 09/26/22 1350 09/26/22 1535 09/26/22 2116 09/27/22 0728  GLUCAP 161* 206* 130* 92    Past Medical  History:  He,  has a past medical history of Allergic rhinitis, Anxiety, Arthritis, Asthma, Chronic back pain, COPD (chronic obstructive pulmonary disease) (HCC), Depression, Dyspnea, Hyperlipidemia, Hypertension, Migraine headache, Right leg numbness, Sleep apnea, and TIA (transient ischemic attack) (2015).   Surgical History:   Past Surgical History:  Procedure Laterality Date   CARPAL TUNNEL RELEASE     CARPAL TUNNEL RELEASE Right 10/13/2017   Procedure: CARPAL TUNNEL RELEASE;  Surgeon: Christena Flake, MD;  Location: [facility];  Service: Orthopedics;  Laterality: Right;   CARPAL TUNNEL RELEASE Right 09/23/2022   Procedure: REVISION OPEN RIGHT CARPAL TUNNEL RELEASE;  Surgeon: Christena Flake, MD;  Location: [facility];  Service: Orthopedics;  Laterality: Right;   GANGLION CYST EXCISION Right 10/13/2017   Procedure: REMOVAL GANGLION OF WRIST;  Surgeon: Christena Flake, MD;  Location: [facility];  Service: Orthopedics;  Laterality: Right;   KNEE SURGERY     LAMINECTOMY     LUMBAR DISC SURGERY       Social History:   reports that he quit smoking about 5 years ago. His smoking use included cigarettes. He started smoking about 60 years ago. He has a 55 pack-year smoking history. He has never used smokeless tobacco. He reports that he does not drink alcohol and does not use drugs.   Family History:  His family history is not on file.   Allergies Allergies  Allergen Reactions   Amoxicillin Diarrhea   Morphine Nausea And Vomiting    Morphine Analogues     Home Medications  Prior to Admission medications   Medication Sig Start Date End Date Taking? Authorizing Provider  albuterol (PROVENTIL HFA;VENTOLIN HFA) 108 (90 Base) MCG/ACT inhaler Inhale 2 puffs into the lungs every 6 (six) hours as needed for wheezing or shortness of breath. 04/04/18  Yes Veronese, [place], MD  amLODipine (NORVASC) 2.5 MG tablet Take 1 tablet by mouth at bedtime. 06/26/15  Yes [provider]   fluticasone-salmeterol (ADVAIR) 500-50 MCG/ACT AEPB Inhale 1 puff into the lungs in the morning and at bedtime.   Yes [provider]  gabapentin (NEURONTIN) 300 MG capsule Take 600 mg by mouth 3 (three) times daily.   Yes [provider]  ibuprofen (ADVIL) 800 MG tablet Take 1 tablet (800 mg total) by mouth every 8 (eight) hours as needed for mild pain or moderate pain. 09/23/22  Yes Poggi, Excell Seltzer, MD  oxyCODONE (ROXICODONE) 5 MG immediate release tablet Take 1-2 tablets (5-10 mg total) by mouth every 6 (six) hours as needed for breakthrough pain. 09/23/22  Yes Poggi, Excell Seltzer, MD  oxyCODONE-acetaminophen (PERCOCET) 10-325 MG tablet Take 1 tablet by mouth every 6 (six) hours as needed. 10/13/17  Yes Poggi, Excell Seltzer, MD  senna (SENOKOT) 8.6 MG tablet Take 1 tablet by mouth daily.   Yes [provider]  tiotropium (SPIRIVA) 18 MCG inhalation capsule Place 18 mcg into inhaler and inhale daily.   Yes [provider]  aspirin 81 MG chewable tablet Chew 81 mg by mouth daily. Patient not taking: Reported on 09/24/2022    [provider]  DULoxetine (CYMBALTA) 30 MG capsule Take 1 capsule by mouth 2 (two) times daily. Patient not taking: Reported on 09/24/2022 06/26/15   [provider]  montelukast (SINGULAIR) 10 MG tablet Take 10 mg by mouth at bedtime. Patient not taking: Reported on 09/24/2022    [provider]     Critical care time: 31 minutes    Raechel Chute, MD [facility] 09/27/2022 8:22 AM

## 2022-09-27 NOTE — Progress Notes (Signed)
Temporarily took patient off bipap so he could eat dinner. Patient took a few bites of his sandwich and went into respiratory distress. O2 saturation dropped to mid 80s, RR 30s. Placed patient back on bipap. Excessive coughing, producing a small piece of bread that he appears to have aspirated on. Patient extremely anxious even after respiratory status stabilized. Will keep patient on bipap for remainder of shift.

## 2022-09-27 NOTE — Progress Notes (Signed)
  PROGRESS NOTE    Bruce Harding  IRJ:[id number] DOB: [birth year] DOA: 09/24/2022 PCP: Mickel Fuchs, MD  IC20A/IC20A-AA  LOS: 3 days   Brief hospital course:   Assessment & Plan:  62 y.o. male with medical history significant for hypertension, TIA, COPD PE, tobacco abuse  and carpal tunnel release surgery 2 days prior who presented with complaints of shortness of breath.  Patient denied associated fevers, chills, hemoptysis, nausea vomiting.  Symptoms associated with cough  and exertional dypnea. Patient did receive Solu-Medrol, bronchodilators  and antibiotics. He remains on oxygen supplementation due to exertional dyspnea    Acute respiratory failure with hypoxia and hypercapnia Secondary to Emphysema/COPD exacerbation.  --currently on BiPAP, feels anxious and dyspneic without it. --cont solumedrol for now --cont ceftriaxone and azithro for 3 days for empiric tx, per pulm --cont DuoNeb scheduled   Right Carpal tunnel release on 09/23/22 --pt reported pain not controlled due to being on chronic high-dose opioids at baseline --increase oxycodone to 15 mg q4h PRN  Chronic pain on chronic opioids --increase oxycodone to 15 mg q4h PRN for now --taper down home gabapentin, per pulm rec due to its increased risk for severe COPD exacerbation.  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 Cessation advised.   Hypertension:   --cont amlodipine  Anxiety --seems to be associated with dyspnea.  Clonazepam started. --cont clonazepam at reduced 0.25 mg TID PRN with mealtime to help pt tolerate being off BiPAP.   DVT prophylaxis: Lovenox SQ Code Status: Full code  Family Communication:  Level of care: Stepdown Dispo:   The patient is from: home Anticipated d/c is to: home Anticipated d/c date is: 2-3 days   Subjective and Interval History:  Pt  complained of pain wrist pain.    RN reported pt couldn't get off BiPAP due to dyspnea and anxiety.   Objective: Vitals:   09/27/22 1330 09/27/22 1430 09/27/22 1500 09/27/22 1549  BP:      Pulse:  99    Resp:  17    Temp:      TempSrc:      SpO2: 95% 98% 96% 91%  Weight:      Height:        Intake/Output Summary (Last 24 hours) at 09/27/2022 1602 Last data filed at 09/27/2022 1515 Gross per 24 hour  Intake 400.66 ml  Output 1450 ml  Net -1049.34 ml   Filed Weights   09/24/22 1537 09/26/22 1536  Weight: 66.7 kg 69.9 kg    Examination:   Constitutional: NAD, AAOx3 HEENT: conjunctivae and lids normal, EOMI CV: No cyanosis.   RESP: on BiPAP Extremities: brace over right wrist Neuro: II - XII grossly intact.     Data Reviewed: I have personally reviewed labs and imaging studies  Time spent: 50 minutes  Bruce Priestly, MD [facility] If 7PM-7AM, please contact night-coverage 09/27/2022, 4:02 PM

## 2022-09-27 NOTE — Plan of Care (Signed)
  Problem: Education: Goal: Knowledge of disease or condition will improve Outcome: Progressing Goal: Knowledge of the prescribed therapeutic regimen will improve Outcome: Progressing Goal: Individualized Educational Video(s) Outcome: Progressing   Problem: Activity: Goal: Ability to tolerate increased activity will improve Outcome: Progressing Goal: Will verbalize the importance of balancing activity with adequate rest periods Outcome: Progressing   Problem: Respiratory: Goal: Ability to maintain a clear airway will improve Outcome: Progressing Goal: Levels of oxygenation will improve Outcome: Progressing Goal: Ability to maintain adequate ventilation will improve Outcome: Progressing   Problem: Education: Goal: Knowledge of General Education information will improve Description: Including pain rating scale, medication(s)/side effects and non-pharmacologic comfort measures Outcome: Progressing   Problem: Health Behavior/Discharge Planning: Goal: Ability to manage health-related needs will improve Outcome: Progressing   Problem: Clinical Measurements: Goal: Ability to maintain clinical measurements within normal limits will improve Outcome: Progressing Goal: Will remain free from infection Outcome: Progressing Goal: Diagnostic test results will improve Outcome: Progressing Goal: Respiratory complications will improve Outcome: Progressing Goal: Cardiovascular complication will be avoided Outcome: Progressing   Problem: Activity: Goal: Risk for activity intolerance will decrease Outcome: Progressing   Problem: Nutrition: Goal: Adequate nutrition will be maintained Outcome: Progressing   Problem: Coping: Goal: Level of anxiety will decrease Outcome: Progressing   Problem: Elimination: Goal: Will not experience complications related to bowel motility Outcome: Progressing Goal: Will not experience complications related to urinary retention Outcome: Progressing    Problem: Pain Managment: Goal: General experience of comfort will improve Outcome: Progressing   Problem: Safety: Goal: Ability to remain free from injury will improve Outcome: Progressing   Problem: Skin Integrity: Goal: Risk for impaired skin integrity will decrease Outcome: Progressing   Problem: Education: Goal: Ability to describe self-care measures that may prevent or decrease complications (Diabetes Survival Skills Education) will improve Outcome: Progressing Goal: Individualized Educational Video(s) Outcome: Progressing   Problem: Coping: Goal: Ability to adjust to condition or change in health will improve Outcome: Progressing   Problem: Fluid Volume: Goal: Ability to maintain a balanced intake and output will improve Outcome: Progressing   Problem: Health Behavior/Discharge Planning: Goal: Ability to identify and utilize available resources and services will improve Outcome: Progressing Goal: Ability to manage health-related needs will improve Outcome: Progressing   Problem: Metabolic: Goal: Ability to maintain appropriate glucose levels will improve Outcome: Progressing   Problem: Nutritional: Goal: Maintenance of adequate nutrition will improve Outcome: Progressing Goal: Progress toward achieving an optimal weight will improve Outcome: Progressing   Problem: Skin Integrity: Goal: Risk for impaired skin integrity will decrease Outcome: Progressing   Problem: Tissue Perfusion: Goal: Adequacy of tissue perfusion will improve Outcome: Progressing   

## 2022-09-28 DIAGNOSIS — J9601 Acute respiratory failure with hypoxia: Secondary | ICD-10-CM | POA: Diagnosis not present

## 2022-09-28 LAB — GLUCOSE, CAPILLARY
Glucose-Capillary: 139 mg/dL — ABNORMAL HIGH (ref 70–99)
Glucose-Capillary: 160 mg/dL — ABNORMAL HIGH (ref 70–99)
Glucose-Capillary: 129 mg/dL — ABNORMAL HIGH (ref 70–99)
Glucose-Capillary: 152 mg/dL — ABNORMAL HIGH (ref 70–99)

## 2022-09-28 MED ORDER — LORAZEPAM 2 MG/ML IJ SOLN
0.5000 mg | Freq: Once | INTRAMUSCULAR | Status: AC | PRN
  Administered 2022-09-28: 0.5 mg via INTRAVENOUS

## 2022-09-28 MED ORDER — LORAZEPAM 2 MG/ML IJ SOLN
INTRAMUSCULAR | Status: AC
  Filled 2022-09-28: qty 1

## 2022-09-28 MED ORDER — OXYCODONE HCL 5 MG PO TABS
10.0000 mg | ORAL_TABLET | Freq: Four times a day (QID) | ORAL | Status: DC | PRN
  Administered 2022-09-28 – 2022-10-03 (×13): 10 mg via ORAL
  Filled 2022-09-28 (×13): qty 2

## 2022-09-28 MED ORDER — PANTOPRAZOLE SODIUM 40 MG PO TBEC
40.0000 mg | DELAYED_RELEASE_TABLET | Freq: Two times a day (BID) | ORAL | Status: DC
  Administered 2022-09-28 – 2022-10-03 (×11): 40 mg via ORAL
  Filled 2022-09-28 (×11): qty 1

## 2022-09-28 MED ORDER — MORPHINE SULFATE 15 MG PO TABS
15.0000 mg | ORAL_TABLET | Freq: Four times a day (QID) | ORAL | Status: DC | PRN
  Administered 2022-09-28 – 2022-09-30 (×5): 15 mg via ORAL
  Filled 2022-09-28 (×5): qty 1

## 2022-09-28 NOTE — Progress Notes (Signed)
  PROGRESS NOTE    KI AKERMAN  JYN:[id number] DOB: 1961/01/19 DOA: 09/24/2022 PCP: Mickel Fuchs, MD  IC20A/IC20A-AA  LOS: 4 days   Brief hospital course:   Assessment & Plan:  62 y.o. male with medical history significant for hypertension, TIA, COPD PE, tobacco abuse  and carpal tunnel release surgery 2 days prior who presented with complaints of shortness of breath.  Patient denied associated fevers, chills, hemoptysis, nausea vomiting.  Symptoms associated with cough  and exertional dypnea. Patient did receive Solu-Medrol, bronchodilators  and antibiotics. He remains on oxygen supplementation due to exertional dyspnea    Acute respiratory failure with hypoxia and hypercapnia 2/2 COPD exacerbation.  --currently on BiPAP, feels anxious and dyspneic without it.  Completed 3 days of ceftriaxone and azithromycin, per pulm rec. --PCCM consulted, since signed off. Plan: --cont solumedrol with taper --cont DuoNeb scheduled --morphine PRN for dyspnea --wean BiPAP as tolerated   Right Carpal tunnel release on 09/23/22 --cont home opioids regimen --add oral morphine PRN for additional pain control and to help with dyspnea  Chronic pain on chronic opioids --cont home oxy 10 mg q6h PRN --taper down home gabapentin, per pulm rec due to its increased risk for severe COPD exacerbation.  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 Cessation advised.   Hypertension:   --cont amlodipine  Anxiety --seems to be associated with dyspnea.  Clonazepam started. --cont clonazepam 0.25 mg TID PRN with mealtime to help pt tolerate being off BiPAP.   DVT prophylaxis: Lovenox SQ Code Status: Full code  Family Communication:  Level of care: Stepdown Dispo:   The patient is from: home Anticipated d/c is to: home Anticipated d/c date is: 2-3  days   Subjective and Interval History:  RN reported pt became very anxious when BiPAP removed, and calmed immediately after oxy and klonopin were given.   Objective: Vitals:   09/28/22 1500 09/28/22 1536 09/28/22 1600 09/28/22 1602  BP: 127/75  (!) 103/56   Pulse: 76 77 79 82  Resp: 13 16 (!) 26 19  Temp:    98.2 F (36.8 C)  TempSrc:      SpO2: 94% 95% 94% 99%  Weight:      Height:        Intake/Output Summary (Last 24 hours) at 09/28/2022 1620 Last data filed at 09/28/2022 1000 Gross per 24 hour  Intake 850 ml  Output 1450 ml  Net -600 ml   Filed Weights   09/24/22 1537 09/26/22 1536  Weight: 66.7 kg 69.9 kg    Examination:   Constitutional: NAD, AAOx3 HEENT: conjunctivae and lids normal, EOMI CV: No cyanosis.   RESP: BiPAP Neuro: II - XII grossly intact.     Data Reviewed: I have personally reviewed labs and imaging studies  Time spent: 50 minutes  Darlin Priestly, MD [facility] If 7PM-7AM, please contact night-coverage 09/28/2022, 4:20 PM

## 2022-09-28 NOTE — Progress Notes (Signed)
PHARMACIST - PHYSICIAN COMMUNICATION  CONCERNING: IV to Oral Route Change Policy  RECOMMENDATION: This patient is receiving pantoprazole by the intravenous route.  Based on criteria approved by the Pharmacy and Therapeutics Committee, the intravenous medication(s) is/are being converted to the equivalent oral dose form(s).  DESCRIPTION: These criteria include: The patient is eating (either orally or via tube) and/or has been taking other orally administered medications for a least 24 hours The patient has no evidence of active gastrointestinal bleeding or impaired GI absorption (gastrectomy, short bowel, patient on TNA or NPO).  If you have questions about this conversion, please contact the Pharmacy Department   Tressie Ellis, [facility] 09/28/2022 7:59 AM

## 2022-09-28 NOTE — Progress Notes (Addendum)
0800 Patient ate large breakfast and talked nonstop with his wife. Patient was ask not to be so active or talk quite as much to conserve his breath and save his energy. Explained that due to his continuing smoking and other factors he had COPD. Now his COPD had exacerbated from being intubated during his recent Carpel Tunnel surgery. On 8/4 he became unresponsive on the floor and received Narcan. He was then sent to ICU. Patient nodded he understood what I had explained to him. Patient encouraged to stay off of BiPAP and learn how to "breathe through" his shortness of breath. Patient refused. Explained situation to Dr Fran Lowes. Dr. Fran Lowes visited patient and also explained this.  1256 Patient had panic attack. Yelling "Help me". Insisted he be put back on BiPAP. Done.Dr.Lai messaged. Heart rate 130s and respiratory rate 30s.  Explained to patient that he was panicing. Patient medicated with his prn Oxy and Klonopin. Rightafter he swallowed the medications he calmed down. Heart rate down to 94 and respiraions in the low 20s.After patient sat up on the side of the bed for 30 minutes,he laid down and slept. 1630 patient upset and mad at nurse and wife because he cannot breathe from his smoking. Also mad with hospital because will not let his 63 year old grandson visit. Explained that no one under 63 years old can visit due to [place] rules not [facility] because of the germs.

## 2022-09-29 DIAGNOSIS — J9601 Acute respiratory failure with hypoxia: Secondary | ICD-10-CM | POA: Diagnosis not present

## 2022-09-29 LAB — GLUCOSE, CAPILLARY
Glucose-Capillary: 116 mg/dL — ABNORMAL HIGH (ref 70–99)
Glucose-Capillary: 166 mg/dL — ABNORMAL HIGH (ref 70–99)
Glucose-Capillary: 70 mg/dL (ref 70–99)
Glucose-Capillary: 143 mg/dL — ABNORMAL HIGH (ref 70–99)

## 2022-09-29 MED ORDER — POLYETHYLENE GLYCOL 3350 17 G PO PACK
17.0000 g | PACK | Freq: Two times a day (BID) | ORAL | Status: DC
  Administered 2022-09-29 – 2022-10-03 (×7): 17 g via ORAL
  Filled 2022-09-29 (×7): qty 1

## 2022-09-29 MED ORDER — HYDROXYZINE HCL 25 MG PO TABS
25.0000 mg | ORAL_TABLET | Freq: Four times a day (QID) | ORAL | Status: DC | PRN
  Administered 2022-09-29 – 2022-10-03 (×9): 25 mg via ORAL
  Filled 2022-09-29 (×9): qty 1

## 2022-09-29 NOTE — Progress Notes (Addendum)
PROGRESS NOTE    Bruce Harding  ZOX:[id number] DOB: 08/08/1960 DOA: 09/24/2022 PCP: Mickel Fuchs, MD  IC20A/IC20A-AA  LOS: 5 days   Brief hospital course:   Assessment & Plan: 62 y.o. male with medical history significant for hypertension, COPD PE, tobacco abuse  and carpal tunnel release surgery 2 days prior who presented with complaints of shortness of breath.    Pt received steroids, antibiotics, and duo-nebs and was admitted to the hospital ward.  Pt was not on BiPAP initially.  On 8/3, he had a rapid response called secondary to increased drowsiness. Patient was given duo-nebs and narcan with improvement in oxygenation and mental status.  ABG performed showed a pH of 7.39, CO2 of 55, and PaO2 of 97. Patient was started on BiPAP for work of breathing and critical care consulted, since signed off.  After pt was placed on BiPAP, it was difficult weaning pt off BiPAP, as pt felt severe anxiety to the point of panic attacks when BiPAP was removed.  Morphine for air hunger and Klonopin were added to help with anxiety symptoms.  Acute respiratory failure with hypoxia and hypercapnia 2/2 COPD exacerbation.  --Completed 3 days of ceftriaxone and azithromycin, per pulm rec. --finally able to tolerate being off of BiPAP today. Plan: --cont solumedrol with taper --cont DuoNeb scheduled --morphine PRN for dyspnea and Klonopin PRN for severe anxiety --Continue supplemental O2 to keep sats between 88-92%, wean as tolerated --need ambulatory O2 test prior to discharge   Right Carpal tunnel release on 09/23/22 --cont home opioids regimen --oral morphine PRN for additional pain control and to help with dyspnea  Chronic pain on chronic opioids --cont home oxy 10 mg q6h PRN --taper down home gabapentin, per pulm rec due to its increased risk for severe COPD exacerbation.  Incidental findings of 5mm Right lung nodule:  further follow up as outpatient   PAD Doppler showed iliac occlusive  disease on the right --discussed with Vascular, since pt had no complaint about his foot, will follow up as outpatient (outpatient referral).   Current smoker Nicotine patch  Cessation advised.   Hypertension:   --cont amlodipine  Anxiety --seems to be associated with dyspnea.  Clonazepam started. --cont clonazepam 0.25 mg TID PRN with mealtime to help pt tolerate being off BiPAP. --taper off as tolerated.   DVT prophylaxis: Lovenox SQ Code Status: Full code  Family Communication:  Level of care: Stepdown Dispo:   The patient is from: home Anticipated d/c is to: home Anticipated d/c date is: 2-3 days   Subjective and Interval History:  Anxiety improved today, pt was able to get off BiPAP.   Objective: Vitals:   09/29/22 0800 09/29/22 1000 09/29/22 1100 09/29/22 1200  BP: 134/87 (!) 153/91 128/68 (!) 145/79  Pulse: 75 87 85 95  Resp: 15 18 19  (!) 22  Temp:    98.2 F (36.8 C)  TempSrc:      SpO2: 96% 95% 97% 95%  Weight:      Height:        Intake/Output Summary (Last 24 hours) at 09/29/2022 1718 Last data filed at 09/29/2022 1300 Gross per 24 hour  Intake --  Output 1650 ml  Net -1650 ml   Filed Weights   09/24/22 1537 09/26/22 1536  Weight: 66.7 kg 69.9 kg    Examination:   Constitutional: NAD, AAOx3 HEENT: conjunctivae and lids normal, EOMI CV: No cyanosis.   RESP: normal respiratory effort, on 2.5L Neuro: II - XII grossly intact.  Psych: Normal mood and affect.  Appropriate judgement and reason   Data Reviewed: I have personally reviewed labs and imaging studies  Time spent: 35 minutes  Darlin Priestly, MD [facility] If 7PM-7AM, please contact night-coverage 09/29/2022, 5:18 PM

## 2022-09-29 NOTE — Progress Notes (Signed)
Patient called RN to room, stated he was having some shortness of breath. Patient became very anxious and was having a panic attack on the BiPAP, calling out "Help me" while sitting in tripod position with arms on the bedside table. RN remained with the patient during this episode. Patient received PRN morphine for air hunger, scheduled nebulizer treatment by RT, and a one time dose of ativan. Patient calmed down and was able to fall back asleep. VSS at this time.  Carmel Sacramento, RN

## 2022-09-30 DIAGNOSIS — I10 Essential (primary) hypertension: Secondary | ICD-10-CM | POA: Diagnosis not present

## 2022-09-30 DIAGNOSIS — J441 Chronic obstructive pulmonary disease with (acute) exacerbation: Secondary | ICD-10-CM | POA: Diagnosis not present

## 2022-09-30 DIAGNOSIS — F17200 Nicotine dependence, unspecified, uncomplicated: Secondary | ICD-10-CM

## 2022-09-30 DIAGNOSIS — F419 Anxiety disorder, unspecified: Secondary | ICD-10-CM

## 2022-09-30 DIAGNOSIS — J9601 Acute respiratory failure with hypoxia: Secondary | ICD-10-CM | POA: Diagnosis not present

## 2022-09-30 DIAGNOSIS — G894 Chronic pain syndrome: Secondary | ICD-10-CM

## 2022-09-30 LAB — GLUCOSE, CAPILLARY
Glucose-Capillary: 105 mg/dL — ABNORMAL HIGH (ref 70–99)
Glucose-Capillary: 158 mg/dL — ABNORMAL HIGH (ref 70–99)
Glucose-Capillary: 135 mg/dL — ABNORMAL HIGH (ref 70–99)
Glucose-Capillary: 163 mg/dL — ABNORMAL HIGH (ref 70–99)

## 2022-09-30 LAB — EXPECTORATED SPUTUM ASSESSMENT W GRAM STAIN, RFLX TO RESP C

## 2022-09-30 MED ORDER — BISACODYL 10 MG RE SUPP
10.0000 mg | Freq: Once | RECTAL | Status: AC
  Administered 2022-09-30: 10 mg via RECTAL
  Filled 2022-09-30: qty 1

## 2022-09-30 MED ORDER — DOCUSATE SODIUM 100 MG PO CAPS
100.0000 mg | ORAL_CAPSULE | Freq: Two times a day (BID) | ORAL | Status: DC
  Administered 2022-09-30 – 2022-10-03 (×5): 100 mg via ORAL
  Filled 2022-09-30 (×5): qty 1

## 2022-09-30 MED ORDER — SENNA 8.6 MG PO TABS
1.0000 | ORAL_TABLET | Freq: Every day | ORAL | Status: DC
  Administered 2022-09-30 – 2022-10-02 (×2): 8.6 mg via ORAL
  Filled 2022-09-30 (×2): qty 1

## 2022-09-30 NOTE — Plan of Care (Signed)
  Problem: Education: Goal: Knowledge of disease or condition will improve Outcome: Progressing Goal: Knowledge of the prescribed therapeutic regimen will improve Outcome: Progressing   Problem: Activity: Goal: Ability to tolerate increased activity will improve Outcome: Progressing   Problem: Respiratory: Goal: Ability to maintain a clear airway will improve Outcome: Progressing Goal: Levels of oxygenation will improve Outcome: Progressing Goal: Ability to maintain adequate ventilation will improve Outcome: Progressing

## 2022-09-30 NOTE — Progress Notes (Signed)
Progress Note   Patient: Bruce Harding ZOX:[id number] DOB: Jul 19, 1960 DOA: 09/24/2022     6 DOS: the patient was seen and examined on 09/30/2022   Brief hospital course: Bruce Harding is a 63 y.o. male with medical history significant for hypertension, COPD PE, tobacco abuse  and carpal tunnel release surgery 2 days prior who presented with complaints of shortness of breath.     Pt received steroids, antibiotics, and duo-nebs and was admitted to the hospital ward.  Pt was not on BiPAP initially.  On 8/3, he had a rapid response called secondary to increased drowsiness. Patient was given duo-nebs and narcan with improvement in oxygenation and mental status.  ABG performed showed a pH of 7.39, CO2 of 55, and PaO2 of 97. Patient was started on BiPAP for work of breathing and critical care consulted, since signed off.   After pt was placed on BiPAP, it was difficult weaning pt off BiPAP, as pt felt severe anxiety to the point of panic attacks when BiPAP was removed.  Morphine for air hunger and Klonopin were added to help with anxiety symptoms.  09/30/22 he is off bipap, on 2L supplemental o2.  Assessment and Plan: Acute respiratory failure with hypoxia and hypercapnia 2/2 COPD exacerbation.  Completed 3 days of ceftriaxone and azithromycin, per pulm rec. Off of BiPAP last 24 hr Continue solumedrol with taper DuoNeb scheduled AND prn Morphine PRN for dyspnea and Klonopin PRN for severe anxiety Continue supplemental O2 to keep sats between 88-92%, wean as tolerated Will get home O2 assessment prior to discharge   Right Carpal tunnel release on 09/23/22 Cont home opioids regimen Oral morphine PRN for additional pain control and to help with dyspnea   Chronic pain on chronic opioids Continue home oxy 10 mg q6h PRN Taper home gabapentin, per pulm rec due to its increased risk for severe COPD exacerbation. Constipation regimen ordered.   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   Current smoker Nicotine patch  Cessation counseled.   Hypertension:   Continue amlodipine. BP stable.   Anxiety Clonazepam 0.25 mg TID PRN with mealtime to help pt tolerate being off BiPAP. Plan to taper off as tolerated.      Subjective: Patient is seen and examined today morning.  He is very weak, currently on 2 L supplemental oxygen.  He did not require BiPAP last 24 hours.  Has constipation. Feels calm, willing to work with PT  Physical Exam: Vitals:   09/30/22 0900 09/30/22 1000 09/30/22 1200 09/30/22 1300  BP: (!) 158/85 114/72 (!) 150/89 121/68  Pulse: (!) 103 78 80 84  Resp: 18 16 19  (!) 21  Temp:      TempSrc:      SpO2: 97% 96% 96% 98%  Weight:      Height:       General - Elderly ill Caucasian male, mild respiratory distress HEENT - PERRLA, EOMI, atraumatic head, non tender sinuses. Lung -decreased bilateral breath sounds, by resolved Rales Heart - S1, S2 heard, no murmurs, rubs, trace pedal edema Neuro - Alert, awake and oriented x 3, non focal exam. Skin - Warm and dry. Data Reviewed:     Latest Ref Rng & Units 09/30/2022    5:54 AM 09/29/2022    5:09 AM 09/28/2022    6:22 AM  CBC  WBC 4.0 - 10.5  K/uL 14.2  13.4  14.8   Hemoglobin 13.0 - 17.0 g/dL 16.1  09.6  04.5   Hematocrit 39.0 - 52.0 % 48.1  48.2  46.1   Platelets 150 - 400 K/uL 285  284  280        Latest Ref Rng & Units 09/30/2022    5:54 AM 09/29/2022    5:09 AM 09/28/2022    6:22 AM  BMP  Glucose 70 - 99 mg/dL 409  811  914   BUN 8 - 23 mg/dL 24  24  24    Creatinine 0.61 - 1.24 mg/dL 7.82  9.56  2.13   Sodium 135 - 145 mmol/L 136  138  137   Potassium 3.5 - 5.1 mmol/L 4.7  4.9  4.7   Chloride 98 - 111 mmol/L 96  97  97   CO2 22 - 32 mmol/L 33  33  31   Calcium 8.9 - 10.3 mg/dL 9.2  9.2  9.1    Family Communication: Patient's wife updated regarding his  current care plan.  Disposition: Status is: Inpatient Remains inpatient appropriate because: labile respiratory status, off bipap.  Planned Discharge Destination: Home with Home Health    MDM level 3- Patient is on supplemental oxygen still short of breath, did not get out of bed. Respiratory status labile. Need close hemodynamic, telemetry monitoring. He is at high risk for clinicla deterioration.  Author: Marcelino Duster, MD 09/30/2022 2:53 PM  For on call review [email or web address].

## 2022-09-30 NOTE — Progress Notes (Signed)
PT Cancellation Note  Patient Details Name: Bruce Harding MRN: [id number] DOB: Apr 28, 1960   Cancelled Treatment:    Reason Eval/Treat Not Completed: Other (comment). Pt currently on bipap. RE-evaluation order received. Pt seated in recliner. Asked to leave on bipap, unable to ambulate for O2 testing at this time. Will re-attempt tomorrow.  Of note- pt initially evaluated 8/2- no needs identified at that time and pt qualified for home O2 per notes.   , 09/30/2022, 3:27 PM Elizabeth Palau, PT, DPT, [facility] [phone]

## 2022-10-01 DIAGNOSIS — F17200 Nicotine dependence, unspecified, uncomplicated: Secondary | ICD-10-CM | POA: Diagnosis not present

## 2022-10-01 DIAGNOSIS — I10 Essential (primary) hypertension: Secondary | ICD-10-CM | POA: Diagnosis not present

## 2022-10-01 DIAGNOSIS — J9601 Acute respiratory failure with hypoxia: Secondary | ICD-10-CM | POA: Diagnosis not present

## 2022-10-01 DIAGNOSIS — J441 Chronic obstructive pulmonary disease with (acute) exacerbation: Secondary | ICD-10-CM | POA: Diagnosis not present

## 2022-10-01 LAB — BLOOD GAS, ARTERIAL
Acid-Base Excess: 9.1 mmol/L — ABNORMAL HIGH (ref 0.0–2.0)
Bicarbonate: 36.3 mmol/L — ABNORMAL HIGH (ref 20.0–28.0)
Delivery systems: POSITIVE
Expiratory PAP: 7 cmH2O
FIO2: 30 %
Inspiratory PAP: 14 cmH2O
O2 Saturation: 94.9 %
Patient temperature: 37
pCO2 arterial: 60 mmHg — ABNORMAL HIGH (ref 32–48)
pH, Arterial: 7.39 (ref 7.35–7.45)
pO2, Arterial: 68 mmHg — ABNORMAL LOW (ref 83–108)

## 2022-10-01 LAB — CULTURE, RESPIRATORY W GRAM STAIN

## 2022-10-01 LAB — GLUCOSE, CAPILLARY
Glucose-Capillary: 151 mg/dL — ABNORMAL HIGH (ref 70–99)
Glucose-Capillary: 103 mg/dL — ABNORMAL HIGH (ref 70–99)
Glucose-Capillary: 157 mg/dL — ABNORMAL HIGH (ref 70–99)

## 2022-10-01 MED ORDER — MORPHINE SULFATE (PF) 2 MG/ML IV SOLN
2.0000 mg | INTRAVENOUS | Status: DC | PRN
  Administered 2022-10-01 – 2022-10-02 (×4): 2 mg via INTRAVENOUS
  Filled 2022-10-01 (×5): qty 1

## 2022-10-01 MED ORDER — LORAZEPAM 2 MG/ML PO CONC
1.0000 mg | Freq: Four times a day (QID) | ORAL | Status: DC | PRN
  Administered 2022-10-01 – 2022-10-03 (×4): 1 mg via ORAL
  Filled 2022-10-01 (×4): qty 1

## 2022-10-01 NOTE — Progress Notes (Signed)
PT Cancellation Note  Patient Details Name: GEOFFRY PICKELL MRN: [id number] DOB: [birth year]   Cancelled Treatment:    Reason Eval/Treat Not Completed: Other (comment). Pt attempted twice this AM, however on first attempt, pt on bipap not wanting to remove and 2nd attempt, pt having active panic attack. Will re-attempt, time permitting.   , 10/01/2022, 10:20 AM Elizabeth Palau, PT, DPT, [facility] [phone]

## 2022-10-01 NOTE — Progress Notes (Signed)
Progress Note   Patient: Bruce Harding WGN:[id number] DOB: 05/21/1960 DOA: 09/24/2022     7 DOS: the patient was seen and examined on 10/01/2022   Brief hospital course: Derrol Sharman is a 62 y.o. male with medical history significant for hypertension, COPD PE, tobacco abuse  and carpal tunnel release surgery 2 days prior who presented with complaints of shortness of breath. Pt received steroids, antibiotics, and duo-nebs and was admitted to the hospital ward.  Patient was not on BiPAP initially.  On 8/3, he had a rapid response called secondary to increased drowsiness. Patient was given duo-nebs and narcan with improvement in oxygenation and mental status.  ABG CO2 of 55, and PaO2 of 97, started on BiPAP for work of breathing and critical care consulted, since signed off. Patient has severe anxiety, panic attacks causing BIPAP weaning difficult.  Morphine for air hunger and Klonopin were added to help with anxiety symptoms.  09/30/22 he is off bipap, on 2L supplemental o2.  10/01/22  he seems to have more anxiety back on Bipap, ABG with CO2 60.  Assessment and Plan: Acute respiratory failure with hypoxia and hypercapnia 2/2 COPD exacerbation.  Completed 3 days of ceftriaxone and azithromycin, per pulm rec. Back on BiPAP since this morning. Continue solumedrol with oral taper DuoNeb scheduled and prn. Morphine PRN for dyspnea and Klonopin, Ativan PRN for severe anxiety. Continue supplemental O2 to keep sats between 88-92%, wean as tolerated Need Home O2 assessment prior to discharge   Right Carpal tunnel release on 09/23/22 Cont home opioids regimen Oral morphine PRN for additional pain control and to help with dyspnea   Chronic pain on chronic opioids Continue home oxy 10 mg q6h PRN Taper home gabapentin, due to its increased risk for severe COPD exacerbation. Constipation regimen ordered.   Incidental findings of 5mm Right lung nodule:  Further follow up as outpatient   PAD Doppler  showed iliac occlusive disease on the right Vascular follow up as outpatient (outpatient referral).   Current smoker Nicotine patch  Cessation counseled.   Hypertension:   Continue amlodipine. BP stable.   Anxiety Clonazepam 0.25 mg TID, Ativan IV 1mg  q6 PRN to help pt tolerate being off BiPAP. Plan to taper off Bipap as tolerated.      Subjective: Patient is seen and examined today morning.  He is very anxious, had panic attack, currently on Bipap.  Got Ativan, sleeping comfortably. Does have tachycardia, O2 sats >98%  Physical Exam: Vitals:   10/01/22 0700 10/01/22 0800 10/01/22 0900 10/01/22 1000  BP: 97/70 (!) 99/46 135/88   Pulse: 75 72 (!) 101 (!) 119  Resp: 19 18 (!) 26 (!) 25  Temp:      TempSrc:      SpO2: 100% 100% 97% 98%  Weight:      Height:       General - Elderly ill Caucasian male, severe respiratory distress, anxiety HEENT - PERRLA, EOMI, atraumatic head, non tender sinuses. Lung -decreased bilateral breath sounds, by resolved Rales Heart - S1, S2 heard, no murmurs, rubs, trace pedal edema Neuro - Alert, awake and oriented x 3, non focal exam. Skin - Warm and dry. Data Reviewed:     Latest Ref Rng & Units 10/01/2022    5:04 AM 09/30/2022    5:54 AM 09/29/2022    5:09 AM  CBC  WBC 4.0 - 10.5 K/uL 13.7  14.2  13.4   Hemoglobin 13.0 - 17.0 g/dL 78.4  69.6  29.5   Hematocrit  39.0 - 52.0 % 44.6  48.1  48.2   Platelets 150 - 400 K/uL 262  285  284        Latest Ref Rng & Units 10/01/2022    5:04 AM 09/30/2022    5:54 AM 09/29/2022    5:09 AM  BMP  Glucose 70 - 99 mg/dL 97  161  096   BUN 8 - 23 mg/dL 27  24  24    Creatinine 0.61 - 1.24 mg/dL 0.45  4.09  8.11   Sodium 135 - 145 mmol/L 140  136  138   Potassium 3.5 - 5.1 mmol/L 3.9  4.7  4.9   Chloride 98 - 111 mmol/L 99  96  97   CO2 22 - 32 mmol/L 33  33  33   Calcium 8.9 - 10.3 mg/dL 8.4  9.2  9.2    Family Communication: Patient's wife updated regarding his current care plan.  Disposition: Status  is: Inpatient Remains inpatient appropriate because: labile respiratory status, off bipap.  Planned Discharge Destination: Home with Home Health    MDM level 3- Patient is on bipap, has been anxious. Respiratory status labile. Need close hemodynamic, telemetry monitoring in step down. He is at high risk for clinical deterioration.  Author: Marcelino Duster, MD 10/01/2022 11:13 AM  For on call review [email or web address].

## 2022-10-01 NOTE — Evaluation (Signed)
Physical Therapy RE-Evaluation Patient Details Name: Bruce Harding MRN: [id number] DOB: [birth year] Today's Date: 10/01/2022  History of Present Illness  62 y/o male presented to ED on 09/24/22 for SOB. Recent R carpal tunnel release on 7/31. Admitted for acute respiratory failure with hypoxia 2/2 COPD exacerbation.PMH: HTN, TIA, COPD, hx of PE, tobacco abuse. Stay complicated by rapid response on 09/26/22 secondary to drowsiness and complicated by bipap use.  Clinical Impression  Re-evaluation performed this date. Pt is a pleasant 62 year old male who was admitted for ARF with stay complicated by transfer to CCU and RR on 09/26/22. Pt performs bed mobility with mod I, transfers with min assist, and ambulation with min assist and HHA. Due to [facility], would trial platform RW to provide more stability with ambulation. Pt is currently not at baseline level. Pt demonstrates deficits with strength/balance/mobility/endurance. Pt on 3L of O2 upon arrival, removed and O2 sats remained at 95% on RA. With exertion, sats unable to be measured secondary to panic attack and needing O2 reapplied. O2 sats stable with gait back to bed and pt requesting to be placed on bipap. RN in room to address. Would benefit from skilled PT to address above deficits and promote optimal return to PLOF. Pt will continue to receive skilled PT services while admitted and will defer to TOC/care team for updates regarding disposition planning.       If plan is discharge home, recommend the following: A little help with walking and/or transfers;A little help with bathing/dressing/bathroom;Assist for transportation;Help with stairs or ramp for entrance   Can travel by private vehicle        Equipment Recommendations  (R platform RW)  Recommendations for Other Services       Functional Status Assessment Patient has had a recent decline in their functional status and demonstrates the ability to make significant improvements in function in  a reasonable and predictable amount of time.     Precautions / Restrictions Precautions Precautions: Fall Restrictions Weight Bearing Restrictions: No      Mobility  Bed Mobility Overal bed mobility: Modified Independent             General bed mobility comments: effortful, once seated at EOB, increased in SOB symptoms    Transfers Overall transfer level: Needs assistance Equipment used: 1 person hand held assist Transfers: Sit to/from Stand Sit to Stand: Min assist           General transfer comment: very effortful. INitial standing on RA, further attempts on O2    Ambulation/Gait Ambulation/Gait assistance: Min assist Gait Distance (Feet): 20 Feet Assistive device: 1 person hand held assist Gait Pattern/deviations: Step-to pattern, Trunk flexed       General Gait Details: very slow and effortful with decreased endurance. Needing seated rest break 1/2 way. Pt with beginning of panic attack. Applied O2 and CNA entered room. Once stablized, able to ambulate back to bed  Stairs            Wheelchair Mobility     Tilt Bed    Modified Rankin (Stroke Patients Only)       Balance Overall balance assessment: Needs assistance Sitting-balance support: Feet supported Sitting balance-Leahy Scale: Good     Standing balance support: Single extremity supported Standing balance-Leahy Scale: Poor                               Pertinent Vitals/Pain Pain Assessment Pain Assessment:  Faces Faces Pain Scale: Hurts even more Pain Location: R hand Pain Descriptors / Indicators: Grimacing, Guarding, Discomfort Pain Intervention(s): Limited activity within patient's tolerance, Repositioned    Home Living Family/patient expects to be discharged to:: Private residence Living Arrangements: Spouse/significant other Available Help at Discharge: Family Type of Home: House Home Access: Stairs to enter;Ramped entrance   Entrance Stairs-Number of  Steps: 1   Home Layout: One level Home Equipment: Cane - single point      Prior Function Prior Level of Function : Independent/Modified Independent;Driving             Mobility Comments: reports no falls ADLs Comments: indep     Extremity/Trunk Assessment   Upper Extremity Assessment Upper Extremity Assessment: Generalized weakness (BUE grossly 3/5) RUE Deficits / Details: immobilized from recent carpal tunnel surgry on 7/31    Lower Extremity Assessment Lower Extremity Assessment: Generalized weakness (B LE grossly 3/5)       Communication   Communication Communication: No apparent difficulties  Cognition Arousal: Alert Behavior During Therapy: Anxious Overall Cognitive Status: Within Functional Limits for tasks assessed                                 General Comments: panicky        General Comments      Exercises     Assessment/Plan    PT Assessment Patient needs continued PT services  PT Problem List Decreased strength;Decreased balance;Decreased mobility;Pain;Cardiopulmonary status limiting activity       PT Treatment Interventions DME instruction;Gait training;Therapeutic activities;Therapeutic exercise;Balance training    PT Goals (Current goals can be found in the Care Plan section)  Acute Rehab PT Goals Patient Stated Goal: to get better PT Goal Formulation: With patient Time For Goal Achievement: 10/15/22 Potential to Achieve Goals: Fair    Frequency Min 1X/week     Co-evaluation               AM-PAC PT "6 Clicks" Mobility  Outcome Measure Help needed turning from your back to your side while in a flat bed without using bedrails?: None Help needed moving from lying on your back to sitting on the side of a flat bed without using bedrails?: None Help needed moving to and from a bed to a chair (including a wheelchair)?: A Little Help needed standing up from a chair using your arms (e.g., wheelchair or bedside  chair)?: A Little Help needed to walk in hospital room?: A Lot Help needed climbing 3-5 steps with a railing? : A Lot 6 Click Score: 18    End of Session Equipment Utilized During Treatment: Oxygen Activity Tolerance: Patient limited by fatigue Patient left: in bed;with call bell/phone within reach;with family/visitor present Nurse Communication: Mobility status PT Visit Diagnosis: Unsteadiness on feet (R26.81);Muscle weakness (generalized) (M62.81);Difficulty in walking, not elsewhere classified (R26.2);Pain Pain - Right/Left: Right Pain - part of body: Arm    Time: 1420-1450 PT Time Calculation (min) (ACUTE ONLY): 30 min   Charges:   PT Evaluation $PT Eval Moderate Complexity: 1 Mod PT Treatments $Gait Training: 8-22 mins PT General Charges $$ ACUTE PT VISIT: 1 Visit         Elizabeth Palau, PT, DPT, GCS [phone]   , 10/01/2022, 4:44 PM

## 2022-10-02 DIAGNOSIS — J441 Chronic obstructive pulmonary disease with (acute) exacerbation: Secondary | ICD-10-CM | POA: Diagnosis not present

## 2022-10-02 DIAGNOSIS — J9601 Acute respiratory failure with hypoxia: Secondary | ICD-10-CM | POA: Diagnosis not present

## 2022-10-02 DIAGNOSIS — I10 Essential (primary) hypertension: Secondary | ICD-10-CM | POA: Diagnosis not present

## 2022-10-02 LAB — GLUCOSE, CAPILLARY
Glucose-Capillary: 82 mg/dL (ref 70–99)
Glucose-Capillary: 191 mg/dL — ABNORMAL HIGH (ref 70–99)
Glucose-Capillary: 147 mg/dL — ABNORMAL HIGH (ref 70–99)
Glucose-Capillary: 150 mg/dL — ABNORMAL HIGH (ref 70–99)

## 2022-10-02 MED ORDER — MORPHINE SULFATE 15 MG PO TABS
15.0000 mg | ORAL_TABLET | Freq: Two times a day (BID) | ORAL | Status: DC | PRN
  Administered 2022-10-02: 15 mg via ORAL
  Filled 2022-10-02 (×2): qty 1

## 2022-10-02 MED ORDER — MORPHINE SULFATE (PF) 2 MG/ML IV SOLN
1.0000 mg | INTRAVENOUS | Status: DC | PRN
  Administered 2022-10-03: 1 mg via INTRAVENOUS
  Filled 2022-10-02: qty 1

## 2022-10-02 MED ORDER — DULOXETINE HCL 30 MG PO CPEP
30.0000 mg | ORAL_CAPSULE | Freq: Two times a day (BID) | ORAL | Status: DC
  Administered 2022-10-02 – 2022-10-03 (×2): 30 mg via ORAL
  Filled 2022-10-02 (×2): qty 1

## 2022-10-02 MED ORDER — IPRATROPIUM-ALBUTEROL 0.5-2.5 (3) MG/3ML IN SOLN
3.0000 mL | Freq: Three times a day (TID) | RESPIRATORY_TRACT | Status: DC
  Administered 2022-10-02 – 2022-10-03 (×3): 3 mL via RESPIRATORY_TRACT
  Filled 2022-10-02 (×3): qty 3

## 2022-10-02 NOTE — TOC Progression Note (Addendum)
Transition of Care [facility]) - Progression Note    Patient Details  Name: SAVANNAH PANICK MRN: [id number] Date of Birth: 1960-03-13  Transition of Care [facility]) CM/SW Contact  Liliana Cline, LCSW Phone Number: 10/02/2022, 3:02 PM  Clinical Narrative:    [facility] unable to accept. Asked [facility] who is unable to accept.  Asked [facility] who is unable to accept. Asked [facility] who is checking with management. Asked [facility] - awaiting response.   Expected Discharge Plan: Home w Home Health Services Barriers to Discharge: Continued Medical Work up  Expected Discharge Plan and Services     Post Acute Care Choice: Home Health Living arrangements for the past 2 months: Single Family Home                                       Social Determinants of Health (SDOH) Interventions SDOH Screenings   Food Insecurity: No Food Insecurity (09/24/2022)  Housing: Low Risk  (09/24/2022)  Transportation Needs: No Transportation Needs (09/24/2022)  Utilities: Not At Risk (09/24/2022)  Tobacco Use: Medium Risk (09/24/2022)  Health Literacy: Medium Risk (08/17/2019)   Received from [facility]    Readmission Risk Interventions     No data to display

## 2022-10-02 NOTE — Consult Note (Addendum)
[facility] Face-to-Face Psychiatry Consult   Reason for Consult:  anxiety/panic Referring Physician:  Delfino Lovett MD Patient Identification: [name] MRN:  [id number] Principal Diagnosis: Acute respiratory failure with hypoxia [facility]) Diagnosis:  Principal Problem:   Acute respiratory failure with hypoxia (HCC) Active Problems:   Acute exacerbation of chronic obstructive pulmonary disease (COPD) (HCC)   Hypertension   Smoking   Decreased pedal pulses   Total Time spent with patient: 30 minutes  Subjective:   Bruce Harding is a 62 y.o. male patient seen due to reports of panic attack. Patient reports "I had a panic attack 2 hours ago".  HPI:  Bruce Harding is a 62 yo male see due to panic attack. Patient denies a history of panic attacks, stating "they started on Thursday". Patient reports taking Cymbalta and gabapentin at home daily which manages symptoms well. Patient reports no adverse medication effects to his home medication regimen. Per patient's record, Cymbalta not noted on hospital orders. Patient prefers to restart Cymbalta, as the abrupt discontinuation of this medication may be causing increased anxiety. Cymbalta to be restarted today. Patient reports that current PRN anti anxiety agents given during hospitalization have managed acute panic attacks, "but takes time to work".  Past Psychiatric History: Per patient, depression  Risk to Self:  denies Risk to Others:  denies Prior Inpatient Therapy:  denies Prior Outpatient Therapy:  therapy 20 years ago  Past Medical History:  Past Medical History:  Diagnosis Date   Allergic rhinitis    Anxiety    Arthritis    Asthma    Chronic back pain    COPD (chronic obstructive pulmonary disease) (HCC)    Depression    Dyspnea    Hyperlipidemia    Hypertension    Migraine headache    none since 2016   Right leg numbness    occasional, S/P back surgery (per pt)   Sleep apnea    no CPAP, since 2017   TIA (transient  ischemic attack) 2015   no deficits    Past Surgical History:  Procedure Laterality Date   CARPAL TUNNEL RELEASE     CARPAL TUNNEL RELEASE Right 10/13/2017   Procedure: CARPAL TUNNEL RELEASE;  Surgeon: Christena Flake, MD;  Location: [facility];  Service: Orthopedics;  Laterality: Right;   CARPAL TUNNEL RELEASE Right 09/23/2022   Procedure: REVISION OPEN RIGHT CARPAL TUNNEL RELEASE;  Surgeon: Christena Flake, MD;  Location: [facility];  Service: Orthopedics;  Laterality: Right;   GANGLION CYST EXCISION Right 10/13/2017   Procedure: REMOVAL GANGLION OF WRIST;  Surgeon: Christena Flake, MD;  Location: [facility];  Service: Orthopedics;  Laterality: Right;   KNEE SURGERY     LAMINECTOMY     LUMBAR DISC SURGERY     Family History: No family history on file. Family Psychiatric  History: n/a Social History:  Social History   Substance and Sexual Activity  Alcohol Use No     Social History   Substance and Sexual Activity  Drug Use No    Social History   Socioeconomic History   Marital status: Married    Spouse name: Bruce Harding   Number of children: Not on file   Years of education: Not on file   Highest education level: Not on file  Occupational History   Not on file  Tobacco Use   Smoking status: Former    Current packs/day: 0.00    Average packs/day: 1 pack/day for 55.0 years (55.0  ttl pk-yrs)    Types: Cigarettes    Start date: 10/1961    Quit date: 10/2016    Years since quitting: 5.9   Smokeless tobacco: Never  Vaping Use   Vaping status: Former   Quit date: 06/23/2017  Substance and Sexual Activity   Alcohol use: No   Drug use: No   Sexual activity: Not on file  Other Topics Concern   Not on file  Social History Narrative   Not on file   Social Determinants of Health   Financial Resource Strain: Not on file  Food Insecurity: No Food Insecurity (09/24/2022)   Hunger Vital Sign    Worried About Running Out of Food in the Last Year: Never true     Ran Out of Food in the Last Year: Never true  Transportation Needs: No Transportation Needs (09/24/2022)   PRAPARE - Administrator, Civil Service (Medical): No    Lack of Transportation (Non-Medical): No  Physical Activity: Not on file  Stress: Not on file  Social Connections: Not on file   Additional Social History:    Allergies:  No Known Allergies  Labs:  Results for orders placed or performed during the hospital encounter of 09/24/22 (from the past 48 hour(s))  Glucose, capillary     Status: Abnormal   Collection Time: 09/30/22  4:38 PM  Result Value Ref Range   Glucose-Capillary 135 (H) 70 - 99 mg/dL    Comment: Glucose reference range applies only to samples taken after fasting for at least 8 hours.  Expectorated Sputum Assessment w Gram Stain, Rflx to Resp Cult     Status: None   Collection Time: 09/30/22  6:32 PM   Specimen: Sputum  Result Value Ref Range   Specimen Description SPUTUM    Special Requests EXPSU    Sputum evaluation      THIS SPECIMEN IS ACCEPTABLE FOR SPUTUM CULTURE Performed at [facility], [address], [place], [place] [address]    Report Status 09/30/2022 FINAL   Culture, Respiratory w Gram Stain     Status: None (Preliminary result)   Collection Time: 09/30/22  6:32 PM   Specimen: SPU  Result Value Ref Range   Specimen Description      SPUTUM Performed at [facility], [address], [place], [place] [address]    Special Requests      EXPSU Reflexed from [id number] Performed at [facility], [address] [place], [place] [address]    Gram Stain      FEW WBC PRESENT, PREDOMINANTLY PMN RARE GRAM POSITIVE COCCI    Culture      CULTURE REINCUBATED FOR BETTER GROWTH Performed at [facility], [address], [place], [place] [address]    Report Status PENDING   Glucose, capillary     Status: Abnormal   Collection Time: 09/30/22  9:20 PM  Result Value Ref Range   Glucose-Capillary 163 (H)  70 - 99 mg/dL    Comment: Glucose reference range applies only to samples taken after fasting for at least 8 hours.  Magnesium     Status: None   Collection Time: 10/01/22  5:04 AM  Result Value Ref Range   Magnesium 2.1 1.7 - 2.4 mg/dL    Comment: Performed at [facility], [address] [place], [place] [address]  CBC     Status: Abnormal   Collection Time: 10/01/22  5:04 AM  Result Value Ref Range  WBC 13.7 (H) 4.0 - 10.5 K/uL   RBC 4.92 4.22 - 5.81 MIL/uL   Hemoglobin 14.6 13.0 - 17.0 g/dL   HCT 78.2 95.6 - 21.3 %   MCV 90.7 80.0 - 100.0 fL   MCH 29.7 26.0 - 34.0 pg   MCHC 32.7 30.0 - 36.0 g/dL   RDW 08.6 57.8 - 46.9 %   Platelets 262 150 - 400 K/uL   nRBC 0.0 0.0 - 0.2 %    Comment: Performed at [facility], [address], [place], [place] [address]  Basic metabolic panel     Status: Abnormal   Collection Time: 10/01/22  5:04 AM  Result Value Ref Range   Sodium 140 135 - 145 mmol/L   Potassium 3.9 3.5 - 5.1 mmol/L   Chloride 99 98 - 111 mmol/L   CO2 33 (H) 22 - 32 mmol/L   Glucose, Bld 97 70 - 99 mg/dL    Comment: Glucose reference range applies only to samples taken after fasting for at least 8 hours.   BUN 27 (H) 8 - 23 mg/dL   Creatinine, Ser 8.41 0.61 - 1.24 mg/dL   Calcium 8.4 (L) 8.9 - 10.3 mg/dL   GFR, Estimated >32 >44 mL/min    Comment: (NOTE) Calculated using the CKD-EPI Creatinine Equation (2021)    Anion gap 8 5 - 15    Comment: Performed at [facility], [address] [place], [place] [address]  Glucose, capillary     Status: Abnormal   Collection Time: 10/01/22  8:16 AM  Result Value Ref Range   Glucose-Capillary 151 (H) 70 - 99 mg/dL    Comment: Glucose reference range applies only to samples taken after fasting for at least 8 hours.  Blood gas, arterial     Status: Abnormal   Collection Time: 10/01/22 10:56 AM  Result Value Ref Range   FIO2 30 %   Delivery systems BILEVEL POSITIVE AIRWAY PRESSURE    Inspiratory  PAP 14 cmH2O   Expiratory PAP 7 cmH2O   pH, Arterial 7.39 7.35 - 7.45   pCO2 arterial 60 (H) 32 - 48 mmHg   pO2, Arterial 68 (L) 83 - 108 mmHg   Bicarbonate 36.3 (H) 20.0 - 28.0 mmol/L   Acid-Base Excess 9.1 (H) 0.0 - 2.0 mmol/L   O2 Saturation 94.9 %   Patient temperature 37.0    Collection site REVIEWED BY    Allens test (pass/fail) PASS PASS    Comment: Performed at [facility], [address] [place], [place] [address]  Glucose, capillary     Status: Abnormal   Collection Time: 10/01/22 12:09 PM  Result Value Ref Range   Glucose-Capillary 103 (H) 70 - 99 mg/dL    Comment: Glucose reference range applies only to samples taken after fasting for at least 8 hours.  Glucose, capillary     Status: Abnormal   Collection Time: 10/01/22  4:32 PM  Result Value Ref Range   Glucose-Capillary 157 (H) 70 - 99 mg/dL    Comment: Glucose reference range applies only to samples taken after fasting for at least 8 hours.  Magnesium     Status: None   Collection Time: 10/02/22  3:41 AM  Result Value Ref Range   Magnesium 2.2 1.7 - 2.4 mg/dL    Comment: Performed at [facility], [address] [place], [place] [address]  CBC     Status: Abnormal   Collection Time: 10/02/22  3:41 AM  Result  Value Ref Range   WBC 14.1 (H) 4.0 - 10.5 K/uL   RBC 4.72 4.22 - 5.81 MIL/uL   Hemoglobin 13.9 13.0 - 17.0 g/dL   HCT 40.9 81.1 - 91.4 %   MCV 89.4 80.0 - 100.0 fL   MCH 29.4 26.0 - 34.0 pg   MCHC 32.9 30.0 - 36.0 g/dL   RDW 78.2 95.6 - 21.3 %   Platelets 225 150 - 400 K/uL   nRBC 0.0 0.0 - 0.2 %    Comment: Performed at [facility], [address] [place], [place] [address]  Basic metabolic panel     Status: Abnormal   Collection Time: 10/02/22  3:41 AM  Result Value Ref Range   Sodium 139 135 - 145 mmol/L   Potassium 3.9 3.5 - 5.1 mmol/L   Chloride 100 98 - 111 mmol/L   CO2 32 22 - 32 mmol/L   Glucose, Bld 94 70 - 99 mg/dL    Comment: Glucose reference range  applies only to samples taken after fasting for at least 8 hours.   BUN 25 (H) 8 - 23 mg/dL   Creatinine, Ser 8.46 0.61 - 1.24 mg/dL   Calcium 8.3 (L) 8.9 - 10.3 mg/dL   GFR, Estimated >96 >29 mL/min    Comment: (NOTE) Calculated using the CKD-EPI Creatinine Equation (2021)    Anion gap 7 5 - 15    Comment: Performed at [facility], [address] [place], [place] [address]  Glucose, capillary     Status: None   Collection Time: 10/02/22  7:26 AM  Result Value Ref Range   Glucose-Capillary 82 70 - 99 mg/dL    Comment: Glucose reference range applies only to samples taken after fasting for at least 8 hours.  Glucose, capillary     Status: Abnormal   Collection Time: 10/02/22 11:29 AM  Result Value Ref Range   Glucose-Capillary 191 (H) 70 - 99 mg/dL    Comment: Glucose reference range applies only to samples taken after fasting for at least 8 hours.  Glucose, capillary     Status: Abnormal   Collection Time: 10/02/22  4:11 PM  Result Value Ref Range   Glucose-Capillary 147 (H) 70 - 99 mg/dL    Comment: Glucose reference range applies only to samples taken after fasting for at least 8 hours.    Current Facility-Administered Medications  Medication Dose Route Frequency Provider Last Rate Last Admin   acetaminophen (TYLENOL) tablet 650 mg  650 mg Oral Q6H PRN Gertha Calkin, MD   650 mg at 10/02/22 1432   Or   acetaminophen (TYLENOL) suppository 650 mg  650 mg Rectal Q6H PRN Gertha Calkin, MD       albuterol (PROVENTIL) (2.5 MG/3ML) 0.083% nebulizer solution 2.5 mg  2.5 mg Nebulization Q2H PRN Irena Cords V, MD   2.5 mg at 09/28/22 0603   amLODipine (NORVASC) tablet 2.5 mg  2.5 mg Oral QHS Irena Cords V, MD   2.5 mg at 10/01/22 2228   benzonatate (TESSALON) capsule 200 mg  200 mg Oral TID PRN Kirstie Peri, MD   200 mg at 09/27/22 0813   Chlorhexidine Gluconate Cloth 2 % PADS 6 each  6 each Topical Q0600 Rockwell Alexandria, RPH   6 each at 10/01/22 1619   docusate sodium  (COLACE) capsule 100 mg  100 mg Oral BID Marcelino Duster, MD   100 mg at 10/02/22 0933   DULoxetine (CYMBALTA) DR capsule 30 mg  30  mg Oral BID , Cranston Neighbor, NP       enoxaparin (LOVENOX) injection 40 mg  40 mg Subcutaneous Q24H Acheampong, Genice Rouge, MD   40 mg at 10/02/22 0933   gabapentin (NEURONTIN) capsule 300 mg  300 mg Oral TID Raechel Chute, MD   300 mg at 10/02/22 1609   guaiFENesin (MUCINEX) 12 hr tablet 600 mg  600 mg Oral QID PRN Kirstie Peri, MD   600 mg at 09/28/22 9629   hydrALAZINE (APRESOLINE) injection 5 mg  5 mg Intravenous Q4H PRN Gertha Calkin, MD       hydrOXYzine (ATARAX) tablet 25 mg  25 mg Oral Q6H PRN Darlin Priestly, MD   25 mg at 10/02/22 1432   insulin aspart (novoLOG) injection 0-15 Units  0-15 Units Subcutaneous TID WC Ezequiel Essex, NP   2 Units at 10/02/22 1615   insulin aspart (novoLOG) injection 0-5 Units  0-5 Units Subcutaneous QHS Ezequiel Essex, NP       ipratropium-albuterol (DUONEB) 0.5-2.5 (3) MG/3ML nebulizer solution 3 mL  3 mL Nebulization TID Delfino Lovett, MD   3 mL at 10/02/22 1340   LORazepam (ATIVAN) 2 MG/ML concentrated solution 1 mg  1 mg Oral Q6H PRN Marcelino Duster, MD   1 mg at 10/02/22 1609   morphine (MSIR) tablet 15 mg  15 mg Oral Q6H PRN Darlin Priestly, MD   15 mg at 09/30/22 1335   morphine (PF) 2 MG/ML injection 2 mg  2 mg Intravenous Q4H PRN Marcelino Duster, MD   2 mg at 10/02/22 1306   nicotine (NICODERM CQ - dosed in mg/24 hours) patch 14 mg  14 mg Transdermal Daily Gertha Calkin, MD   14 mg at 10/02/22 5284   Oral care mouth rinse  15 mL Mouth Rinse 4 times per day Kirstie Peri, MD   15 mL at 10/02/22 1611   Oral care mouth rinse  15 mL Mouth Rinse PRN Kirstie Peri, MD       oxyCODONE (Oxy IR/ROXICODONE) immediate release tablet 10 mg  10 mg Oral Q6H PRN Darlin Priestly, MD   10 mg at 10/02/22 1432   pantoprazole (PROTONIX) EC tablet 40 mg  40 mg Oral BID Tressie Ellis, RPH   40 mg at 10/02/22 0933   polyethylene glycol  (MIRALAX / GLYCOLAX) packet 17 g  17 g Oral BID Darlin Priestly, MD   17 g at 10/02/22 0933   [START ON 10/03/2022] predniSONE (DELTASONE) tablet 30 mg  30 mg Oral Q breakfast Gillis Santa, MD       Followed by   Melene Muller ON 10/06/2022] predniSONE (DELTASONE) tablet 20 mg  20 mg Oral Q breakfast Gillis Santa, MD       Followed by   Melene Muller ON 10/09/2022] predniSONE (DELTASONE) tablet 10 mg  10 mg Oral Q breakfast Gillis Santa, MD       senna (SENOKOT) tablet 8.6 mg  1 tablet Oral QHS Marcelino Duster, MD   8.6 mg at 09/30/22 2151   sodium chloride flush (NS) 0.9 % injection 3 mL  3 mL Intravenous Q12H Gertha Calkin, MD   3 mL at 10/02/22 1242    Musculoskeletal: Strength & Muscle Tone:  n/a Gait & Station:  n/a Patient leans: N/A            Psychiatric Specialty Exam:  Presentation  General Appearance: Disheveled  Eye Contact:Good  Speech:Clear and Coherent  Speech Volume:Normal  Handedness:No data recorded  Mood and  Affect  Mood:Anxious  Affect:Appropriate   Thought Process  Thought Processes:Coherent  Descriptions of Associations:Intact  Orientation:Full (Time, Place and Person)  Thought Content:Logical  History of Schizophrenia/Schizoaffective disorder:No data recorded Duration of Psychotic Symptoms:No data recorded Hallucinations:Hallucinations: None  Ideas of Reference:None  Suicidal Thoughts:Suicidal Thoughts: No  Homicidal Thoughts:Homicidal Thoughts: No   Sensorium  Memory:Immediate Good; Recent Good; Remote Good  Judgment:Good  Insight:Good   Executive Functions  Concentration:Good  Attention Span:Good  Recall:Good  Fund of Knowledge:Good  Language:Good   Psychomotor Activity  Psychomotor Activity:Psychomotor Activity: Normal   Assets  Assets:No data recorded  Sleep  Sleep:No data recorded  Physical Exam: Physical Exam Vitals and nursing note reviewed.  Neurological:     Mental Status: He is oriented to person,  place, and time.    Review of Systems  Psychiatric/Behavioral:  Negative for hallucinations, memory loss, substance abuse and suicidal ideas. The patient is nervous/anxious.   All other systems reviewed and are negative.  Blood pressure (!) 145/67, pulse (!) 101, temperature 97.9 F (36.6 C), resp. rate 20, height 5\' 5"  (1.651 m), weight 69.9 kg, SpO2 97%. Body mass index is 25.64 kg/m.  Treatment Plan Summary: Restart Cymbalta at 30 mg BID, as patient self administered at home. Daily contact with patient to assess and evaluate symptoms and progress in treatment  Disposition: No evidence of imminent risk to self or others at present.   Patient does not meet criteria for psychiatric inpatient admission. Supportive therapy provided about ongoing stressors. Discussed crisis plan, support from social network, calling 911, coming to the Emergency Department, and calling Suicide Hotline.  Mcneil Sober, NP 10/02/2022 4:34 PM

## 2022-10-02 NOTE — TOC Initial Note (Addendum)
Transition of Care [facility]) - Initial/Assessment Note    Patient Details  Name: Bruce Harding MRN: [id number] Date of Birth: 28-Mar-1960  [facility] CM/SW Contact:    Kreg Shropshire, RN Phone Number: 10/02/2022, 11:51 AM  Clinical Narrative:                 CM received consult for [facility] PT.  Pt arrived from ED from: Home Caregiver Support: Spouse DME at Home: [facility], Shower seat, and walker Transportation: Family Previous Services: None HH/SNF Preference: None, pt has  medicaid and HH companies are limited woh take that insurance First Person of Contact: Jennye Moccasin Spouse PCP: Catalina Pizza, MD Cm will continue to follow for toc needs and d/c planning.   Currently on oxygen.   Cm sending out referrals for [facility] PT. Waiting to hear back from referrals. Cm sent referrals to [facility], and [facility], They all declined  Cm awaiting to hear back from Lurena Joiner [phone] from [facility]  Expected Discharge Plan: Home w Home Health Services Barriers to Discharge: Continued Medical Work up   Patient Goals and CMS Choice   CMS Medicare.gov Compare Post Acute Care list provided to:: Patient Choice offered to / list presented to : Patient      Expected Discharge Plan and Services     Post Acute Care Choice: Home Health Living arrangements for the past 2 months: Single Family Home                                      Prior Living Arrangements/Services Living arrangements for the past 2 months: Single Family Home Lives with:: Self, Spouse   Do you feel safe going back to the place where you live?: Yes      Need for Family Participation in Patient Care: Yes (Comment) Care giver support system in place?: Yes (comment) Current home services: DME    Activities of Daily Living Home Assistive Devices/Equipment: Cane (specify quad or straight) ADL Screening (condition at time of admission) Patient's cognitive ability adequate to safely complete daily  activities?: Yes Is the patient deaf or have difficulty hearing?: No Does the patient have difficulty seeing, even when wearing glasses/contacts?: No Does the patient have difficulty concentrating, remembering, or making decisions?: No Patient able to express need for assistance with ADLs?: Yes Does the patient have difficulty dressing or bathing?: No Independently performs ADLs?: Yes (appropriate for developmental age) Does the patient have difficulty walking or climbing stairs?: No Weakness of Legs: None Weakness of Arms/Hands: None  Permission Sought/Granted                  Emotional Assessment Appearance:: Appears stated age Attitude/Demeanor/Rapport: Engaged Affect (typically observed): Calm Orientation: : Oriented to Self, Oriented to Place, Oriented to  Time, Oriented to Situation      Admission diagnosis:  COPD exacerbation (HCC) [J44.1] Acute respiratory failure with hypoxia (HCC) [J96.01] Patient Active Problem List   Diagnosis Date Noted   Decreased pedal pulses 09/25/2022   Acute respiratory failure with hypoxia (HCC) 09/24/2022   Acute exacerbation of chronic obstructive pulmonary disease (COPD) (HCC) 08/02/2020   Hypertension 08/01/2020   Smoking 08/01/2020   PCP:  Mickel Fuchs, MD Pharmacy:   [facility] - [place], [place] - [address] [place] [address] Phone: [phone] Fax: [phone]     Social  Determinants of Health (SDOH) Social History: SDOH Screenings   Food Insecurity: No Food Insecurity (09/24/2022)  Housing: Low Risk  (09/24/2022)  Transportation Needs: No Transportation Needs (09/24/2022)  Utilities: Not At Risk (09/24/2022)  Tobacco Use: Medium Risk (09/24/2022)  Health Literacy: Medium Risk (08/17/2019)   Received from [facility]   SDOH Interventions:     Readmission Risk Interventions     No data to display

## 2022-10-02 NOTE — Progress Notes (Signed)
Physical Therapy Treatment Patient Details Name: Bruce Harding MRN: [id number] DOB: [birth year] Today's Date: 10/02/2022   History of Present Illness 62 y/o male presented to ED on 09/24/22 for SOB. Recent R carpal tunnel release on 7/31. Admitted for acute respiratory failure with hypoxia 2/2 COPD exacerbation.PMH: HTN, TIA, COPD, hx of PE, tobacco abuse. Stay complicated by rapid response on 09/26/22 secondary to drowsiness and complicated by bipap use.    PT Comments  Pt received in bed, he is agreeable to PT session. Pt performs bed mobility supervision, transfers and amb CGA for safety, using a R platform walker. Pt able to amb 75 ft with 3L [place] O2 throughout session and maintained sats of 92-97%. No SOB noted during amb and Pt able to converse throughout amb time. Pt demonstrates decreased gait speed and stride length. Cuing required for RW safety to maintain closer to trunk.  Pt would cont to benefit from skilled PT to address above deficits and promote optimal return to PLOF.    If plan is discharge home, recommend the following: A little help with walking and/or transfers;A little help with bathing/dressing/bathroom;Assist for transportation;Help with stairs or ramp for entrance   Can travel by private vehicle        Equipment Recommendations  Other (comment) (R platform walker)    Recommendations for Other Services       Precautions / Restrictions Precautions Precautions: Fall Required Braces or Orthoses: Splint/Cast Splint/Cast: R wrist splint for carpal tunnel release surgery Restrictions Weight Bearing Restrictions: No     Mobility  Bed Mobility Overal bed mobility: Independent             General bed mobility comments: no SOB noted once EOB    Transfers Overall transfer level: Needs assistance Equipment used: Right platform walker Transfers: Sit to/from Stand Sit to Stand: Contact guard assist           General transfer comment: CGA for STS maintaining  sats of >96%    Ambulation/Gait Ambulation/Gait assistance: Contact guard assist Gait Distance (Feet): 75 Feet Assistive device: Right platform walker Gait Pattern/deviations: Step-through pattern, Decreased stride length       General Gait Details: Very slow, step-thru gait pattern with occasional step-to gait pattern. Decreased stride length but able to converse while amb with no signs of SOB   Stairs             Wheelchair Mobility     Tilt Bed    Modified Rankin (Stroke Patients Only)       Balance Overall balance assessment: Needs assistance Sitting-balance support: Feet supported Sitting balance-Leahy Scale: Good Sitting balance - Comments: Able to maintain seated balance during functional activity   Standing balance support: Single extremity supported, During functional activity, Reliant on assistive device for balance Standing balance-Leahy Scale: Good Standing balance comment: Able to maintain static standing balance with R platform walker support                            Cognition Arousal: Alert Behavior During Therapy: WFL for tasks assessed/performed Overall Cognitive Status: Within Functional Limits for tasks assessed                                 General Comments: Pleasant and motivated today        Exercises      General Comments  Pertinent Vitals/Pain Pain Assessment Pain Assessment: No/denies pain    Home Living                          Prior Function            PT Goals (current goals can now be found in the care plan section) Acute Rehab PT Goals Patient Stated Goal: to get better PT Goal Formulation: With patient Time For Goal Achievement: 10/15/22 Potential to Achieve Goals: Good Progress towards PT goals: Progressing toward goals    Frequency    Min 1X/week      PT Plan      Co-evaluation              AM-PAC PT "6 Clicks" Mobility   Outcome Measure   Help needed turning from your back to your side while in a flat bed without using bedrails?: None Help needed moving from lying on your back to sitting on the side of a flat bed without using bedrails?: None Help needed moving to and from a bed to a chair (including a wheelchair)?: A Little Help needed standing up from a chair using your arms (e.g., wheelchair or bedside chair)?: A Little Help needed to walk in hospital room?: A Little Help needed climbing 3-5 steps with a railing? : A Little 6 Click Score: 20    End of Session Equipment Utilized During Treatment: Oxygen Activity Tolerance: Patient limited by fatigue;Patient tolerated treatment well Patient left: in chair;with call bell/phone within reach;with chair alarm set;with nursing/sitter in room Nurse Communication: Mobility status PT Visit Diagnosis: Unsteadiness on feet (R26.81);Muscle weakness (generalized) (M62.81);Difficulty in walking, not elsewhere classified (R26.2) Pain - Right/Left: Right Pain - part of body: Arm     Time: 3086-5784 PT Time Calculation (min) (ACUTE ONLY): 28 min  Charges:                            Elmon Else, SPT      10/02/2022, 4:24 PM

## 2022-10-02 NOTE — Progress Notes (Signed)
Progress Note   Patient: Bruce Harding:[id number] DOB: Dec 08, 1960 DOA: 09/24/2022     8 DOS: the patient was seen and examined on 10/02/2022   Brief hospital course: Bruce Harding is a 62 y.o. male with medical history significant for hypertension, COPD PE, tobacco abuse  and carpal tunnel release surgery 2 days prior who presented with complaints of shortness of breath. Pt received steroids, antibiotics, and duo-nebs and was admitted to the hospital ward.  Patient was not on BiPAP initially.  On 8/3, he had a rapid response called secondary to increased drowsiness. Patient was given duo-nebs and narcan with improvement in oxygenation and mental status.  ABG CO2 of 55, and PaO2 of 97, started on BiPAP for work of breathing and critical care consulted, since signed off. Patient has severe anxiety, panic attacks causing BIPAP weaning difficult.  Morphine for air hunger and Klonopin were added to help with anxiety symptoms.  09/30/22 he is off bipap, on 2L supplemental o2.  10/01/22  he seems to have more anxiety back on Bipap, ABG with CO2 60.  Assessment and Plan: Acute respiratory failure with hypoxia and hypercapnia 2/2 COPD exacerbation.  Completed 3 days of ceftriaxone and azithromycin, per pulm rec. Off BiPAP Continue solumedrol with oral taper DuoNeb scheduled and prn. Morphine PRN for dyspnea and Klonopin, Ativan PRN for severe anxiety.   Continue supplemental O2 to keep sats between 88-92%, wean as tolerated Need Home O2 assessment prior to discharge   Right Carpal tunnel release on 09/23/22 Cont home opioids regimen Oral morphine PRN for additional pain control and to help with dyspnea. Start tapering off morphine injection and MSIR   Chronic pain on chronic opioids Continue home oxy 10 mg q6h PRN Taper home gabapentin, due to its increased risk for severe COPD exacerbation. 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   Current smoker Nicotine patch  Cessation counseled.   Hypertension:   Continue amlodipine. BP stable.   Anxiety Clonazepam 0.25 mg TID, Ativan IV 1mg  q6 PRN  Appreciate psych input.  Restarted Cymbalta 30 mg twice daily     Subjective: Feeling much better.  Off BiPAP.  Physical Exam: Vitals:   10/02/22 1340 10/02/22 1609 10/02/22 1614 10/02/22 1620  BP:  (!) 145/67    Pulse:  (!) 101    Resp:  20    Temp:  97.9 F (36.6 C)    TempSrc:      SpO2: 95% 95% 97% 97%  Weight:      Height:       General - Elderly ill Caucasian male, severe respiratory distress, anxiety HEENT - PERRLA, EOMI, atraumatic head, non tender sinuses. Lung -decreased bilateral breath sounds, by resolved Rales Heart - S1, S2 heard, no murmurs, rubs, trace pedal edema Neuro - Alert, awake and oriented x 3, non focal exam. Skin - Warm and dry. Data Reviewed:     Latest Ref Rng & Units 10/02/2022    3:41 AM 10/01/2022    5:04 AM 09/30/2022    5:54 AM  CBC  WBC 4.0 - 10.5 K/uL 14.1  13.7  14.2   Hemoglobin 13.0 - 17.0 g/dL 81.1  91.4  78.2   Hematocrit 39.0 - 52.0 % 42.2  44.6  48.1   Platelets 150 - 400 K/uL 225  262  285        Latest Ref Rng & Units 10/02/2022    3:41 AM 10/01/2022    5:04 AM 09/30/2022    5:54 AM  BMP  Glucose 70 - 99 mg/dL 94  97  147   BUN 8 - 23 mg/dL 25  27  24    Creatinine 0.61 - 1.24 mg/dL 8.29  5.62  1.30   Sodium 135 - 145 mmol/L 139  140  136   Potassium 3.5 - 5.1 mmol/L 3.9  3.9  4.7   Chloride 98 - 111 mmol/L 100  99  96   CO2 22 - 32 mmol/L 32  33  33   Calcium 8.9 - 10.3 mg/dL 8.3  8.4  9.2    Family Communication: Patient's wife updated regarding his current care plan.  Disposition: Status is: Inpatient Remains inpatient appropriate because: labile respiratory status, off bipap.  Transferred to PCU  Planned Discharge Destination:  Home with Home Health   Time spent 35 minutes MDM level 3-  Respiratory status labile. Need close hemodynamic, transfer to PCU he remains at high risk for clinical deterioration.  Author: Delfino Lovett, MD 10/02/2022 5:11 PM  For on call review [email or web address].

## 2022-10-03 ENCOUNTER — Other Ambulatory Visit: Payer: Self-pay | Admitting: Internal Medicine

## 2022-10-03 DIAGNOSIS — J441 Chronic obstructive pulmonary disease with (acute) exacerbation: Secondary | ICD-10-CM | POA: Diagnosis not present

## 2022-10-03 DIAGNOSIS — J9601 Acute respiratory failure with hypoxia: Secondary | ICD-10-CM | POA: Diagnosis not present

## 2022-10-03 LAB — GLUCOSE, CAPILLARY: Glucose-Capillary: 73 mg/dL (ref 70–99)

## 2022-10-03 MED ORDER — DULOXETINE HCL 30 MG PO CPEP: 30.0000 mg | ORAL_CAPSULE | Freq: Two times a day (BID) | ORAL | 0 refills | Status: DC

## 2022-10-03 MED ORDER — GUAIFENESIN ER 600 MG PO TB12: 600.0000 mg | ORAL_TABLET | Freq: Two times a day (BID) | ORAL | 0 refills | Status: AC | PRN

## 2022-10-03 MED ORDER — MONTELUKAST SODIUM 10 MG PO TABS: 10.0000 mg | ORAL_TABLET | Freq: Every day | ORAL | 0 refills | Status: DC

## 2022-10-03 MED ORDER — POLYETHYLENE GLYCOL 3350 17 G PO PACK: 17.0000 g | PACK | Freq: Two times a day (BID) | ORAL | 0 refills | Status: DC

## 2022-10-03 MED ORDER — NICOTINE 14 MG/24HR TD PT24: 14.0000 mg | MEDICATED_PATCH | Freq: Every day | TRANSDERMAL | 0 refills | Status: DC

## 2022-10-03 NOTE — Progress Notes (Addendum)
  Patient Saturations on Room Air at Rest = 89%  Patient Saturations on [employer] while Ambulating = 87%  Patient Saturations on 2 Liters of oxygen while Ambulating = 98 %

## 2022-10-03 NOTE — Discharge Summary (Addendum)
Physician Discharge Summary   Patient: Bruce Harding MRN: [id number] DOB: [birth year]  Admit date:     09/24/2022  Discharge date: 10/03/22  Discharge Physician: Delfino Lovett   PCP: Mickel Fuchs, MD   Recommendations at discharge:   Follow-up with outpatient providers as requested  Discharge Diagnoses: Principal Problem:   Acute respiratory failure with hypoxia (HCC) Active Problems:   Acute exacerbation of chronic obstructive pulmonary disease (COPD) (HCC)   Hypertension   Smoking   Decreased pedal pulses  Hospital Course: Assessment and Plan:  Bruce Harding is a 62 y.o. male with medical history significant for hypertension, COPD PE, tobacco abuse  and carpal tunnel release surgery 2 days prior who presented with complaints of shortness of breath. Pt received steroids, antibiotics, and duo-nebs and was admitted to the hospital ward.  Patient was not on BiPAP initially.  On 8/3, he had a rapid response called secondary to increased drowsiness. Patient was given duo-nebs and narcan with improvement in oxygenation and mental status.  ABG CO2 of 55, and PaO2 of 97, started on BiPAP for work of breathing and critical care consulted, since signed off. Patient has severe anxiety, panic attacks causing BIPAP weaning difficult.  Morphine for air hunger and Klonopin were added to help with anxiety symptoms.  09/30/22 he is off bipap, on 2L supplemental o2.  10/01/22  he seems to have more anxiety back on Bipap, ABG with CO2 60.   Assessment and Plan: Acute respiratory failure with hypoxia and hypercapnia 2/2 COPD exacerbation.  Initially required BiPAP which was weaned off.  He is on room air nasal cannula oxygen primarily needing only on ambulation. Treated with steroids, antibiotics, nebulizers and inhalers with good improvement. He was recommended to follow-up with outpatient pulmonologist Dr. Meredeth Ide.  He shared that he already has an appointment coming up soon He is set up to receive 2  L oxygen via nasal cannula at discharge.  He will also get delivered platform walker at home per [facility]   Right Carpal tunnel release on 09/23/22 Cont home opioids regimen  Chronic pain on chronic opioids Continue home oxy    Incidental findings of 5mm Right lung nodule:  follow up as outpatient with Dr. 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   Current smoker Cessation counseled.   Hypertension:   Continue home medications   Anxiety Seen by psych and restarted Cymbalta 30 mg twice daily.  Recommend outpatient psychiatry follow-up       Consultants: PCCM Disposition: Home health Diet recommendation:  Discharge Diet Orders (From admission, onward)     Start     Ordered   10/03/22 0000  Diet - low sodium heart healthy        10/03/22 0838           Carb modified diet DISCHARGE MEDICATION: Allergies as of 10/03/2022   No Known Allergies      Medication List     STOP taking these medications    aspirin 81 MG chewable tablet   DULoxetine 30 MG capsule Commonly known as: CYMBALTA   oxyCODONE 5 MG immediate release tablet Commonly known as: Roxicodone       TAKE these medications    albuterol 108 (90 Base) MCG/ACT inhaler Commonly known as: VENTOLIN HFA Inhale 2 puffs into the lungs every 6 (six) hours as needed for wheezing or shortness of breath.   amLODipine 2.5 MG tablet Commonly known  as: NORVASC Take 1 tablet by mouth at bedtime.   fluticasone-salmeterol 500-50 MCG/ACT Aepb Commonly known as: ADVAIR Inhale 1 puff into the lungs in the morning and at bedtime.   gabapentin 300 MG capsule Commonly known as: NEURONTIN Take 600 mg by mouth 3 (three) times daily.   guaiFENesin 600 MG 12 hr tablet Commonly known as: MUCINEX Take 1 tablet (600 mg total) by mouth 2 (two) times daily as needed for up to 7 days for cough or to loosen phlegm.   ibuprofen  800 MG tablet Commonly known as: ADVIL Take 1 tablet (800 mg total) by mouth every 8 (eight) hours as needed for mild pain or moderate pain.   montelukast 10 MG tablet Commonly known as: SINGULAIR Take 1 tablet (10 mg total) by mouth at bedtime.   nicotine 14 mg/24hr patch Commonly known as: NICODERM CQ - dosed in mg/24 hours Place 1 patch (14 mg total) onto the skin daily.   oxyCODONE-acetaminophen 10-325 MG tablet Commonly known as: PERCOCET Take 1 tablet by mouth every 6 (six) hours as needed.   polyethylene glycol 17 g packet Commonly known as: MIRALAX / GLYCOLAX Take 17 g by mouth 2 (two) times daily.   senna 8.6 MG tablet Commonly known as: SENOKOT Take 1 tablet by mouth daily.   tiotropium 18 MCG inhalation capsule Commonly known as: SPIRIVA Place 18 mcg into inhaler and inhale daily.               Durable Medical Equipment  (From admission, onward)           Start     Ordered   10/03/22 1104  For home use only DME Walker platform  Once       Question:  Patient needs a walker to treat with the following condition  Answer:  Ambulatory dysfunction   10/03/22 1103   10/03/22 0852  For home use only DME oxygen  Once       Question Answer Comment  Length of Need 6 Months   Mode or (Route) Nasal cannula   Liters per Minute 2   Frequency Continuous (stationary and portable oxygen unit needed)   Oxygen conserving device Yes   Oxygen delivery system Gas      10/03/22 0851            Follow-up Information     Mickel Fuchs, MD. Schedule an appointment as soon as possible for a visit in 3 day(s).   Specialty: Family Medicine Why: [facility] Discharge F/UP Contact information: [address] [place] [address] [phone]         Mertie Moores, MD. Schedule an appointment as soon as possible for a visit in 2 week(s).   Specialty: Specialist Why: [facility] Discharge F/UP Contact information: [address] [place] [address] [phone]         Poggi, Excell Seltzer, MD. Schedule an appointment as soon as possible for a visit in 3 week(s).   Specialty: Orthopedic Surgery Why: [facility] Discharge F/UP Contact information: [address] [facility] [place] [address] [phone]                Discharge Exam: Ceasar Mons Weights   09/24/22 1537 09/26/22 1536 10/03/22 0411  Weight: 66.7 kg 69.9 kg 67.2 kg   General -62 year old Caucasian male lying in the bed comfortably without any acute distress HEENT - PERRLA, EOMI, atraumatic head, non tender sinuses. Lung -decreased bilateral  breath sounds at the bases Heart - S1, S2 heard, no murmurs, rubs, trace pedal edema Neuro - Alert, awake and oriented x 3, non focal exam. Skin - Warm and dry.  Condition at discharge: fair  The results of significant diagnostics from this hospitalization (including imaging, microbiology, ancillary and laboratory) are listed below for reference.   Imaging Studies: DG Chest Port 1 View  Result Date: 09/26/2022 CLINICAL DATA:  Acute respiratory failure. EXAM: PORTABLE CHEST 1 VIEW COMPARISON:  09/24/2022 FINDINGS: Lungs are hyperexpanded. Interstitial markings are diffusely coarsened with chronic features. The lungs are clear without focal pneumonia, edema, pneumothorax or pleural effusion. The cardiopericardial silhouette is within normal limits for size. No acute bony abnormality. Telemetry leads overlie the chest. IMPRESSION: Hyperexpansion with chronic interstitial coarsening. No acute cardiopulmonary findings. Electronically Signed   By: Kennith Center M.D.   On: 09/26/2022 15:27   US ARTERIAL LOWER EXTREMITY DUPLEX BILATERAL  Result Date: 09/25/2022 CLINICAL DATA:  62 year old male with decreased pedal pulses EXAM: NONINVASIVE PHYSIOLOGIC VASCULAR STUDY OF BILATERAL LOWER EXTREMITIES TECHNIQUE: Evaluation of both lower extremities was performed at rest, including calculation of  ankle-brachial indices, directed duplex COMPARISON:  None Available. FINDINGS: Right ABI:  Not acquired Left ABI:  Not acquired Right Lower Extremity: Directed duplex of the right lower extremity demonstrates monophasic common femoral artery, profunda femoris, SFA, popliteal artery. Monophasic posterior tibial artery and anterior tibial artery. Left Lower Extremity: Directed duplex of the left lower extremity demonstrates triphasic common femoral artery, SFA, profunda femoris, popliteal artery. Triphasic posterior tibial artery and anterior tibial artery. No elevated velocities identified. IMPRESSION: Right: Directed duplex of the right lower extremity demonstrates iliac occlusive disease. Left: Directed duplex of the left lower extremity demonstrates waveforms maintained throughout Signed, Yvone Neu. Miachel Roux, RPVI [facility] Electronically Signed   By: Gilmer Mor D.O.   On: 09/25/2022 13:17   CT Angio Chest Pulmonary Embolism (PE) W or WO Contrast  Result Date: 09/24/2022 CLINICAL DATA:  Short of breath, COPD EXAM: CT ANGIOGRAPHY CHEST WITH CONTRAST TECHNIQUE: Multidetector CT imaging of the chest was performed using the standard protocol during bolus administration of intravenous contrast. Multiplanar CT image reconstructions and MIPs were obtained to evaluate the vascular anatomy. RADIATION DOSE REDUCTION: This exam was performed according to the departmental dose-optimization program which includes automated exposure control, adjustment of the mA and/or kV according to patient size and/or use of iterative reconstruction technique. CONTRAST:  75mL OMNIPAQUE IOHEXOL 350 MG/ML SOLN COMPARISON:  09/24/2022, 08/26/2022, 07/09/2022 FINDINGS: Cardiovascular: This is a technically adequate evaluation of the pulmonary vasculature. No filling defects or pulmonary emboli. The heart is unremarkable without pericardial effusion. No evidence of thoracic  aortic aneurysm or dissection. Stable atherosclerosis of the aorta and coronary vasculature. Mediastinum/Nodes: No enlarged mediastinal, hilar, or axillary lymph nodes. Thyroid gland, trachea, and esophagus demonstrate no significant findings. Lungs/Pleura: Stable emphysema. No acute airspace disease, effusion, or pneumothorax. There is bilateral bronchial wall thickening, with scattered areas of mucous plugging within the lower lobe bronchial tree, consistent with bronchitis. Stable 5 mm subpleural right middle lobe nodule image 92/5. Upper Abdomen: No acute abnormality. Musculoskeletal: No acute or destructive bony abnormalities. Reconstructed images demonstrate no additional findings. Review of the MIP images confirms the above findings. IMPRESSION: 1. No evidence of pulmonary embolus. 2. Emphysema, with bilateral bronchial wall thickening greatest in the lower lobes consistent with superimposed bronchitis. 3. Stable 5 mm subpleural right middle lobe nodule, likely benign. Continued routine surveillance with  low-dose lung cancer screening CT as recommended on prior 07/13/2022 exam. 4. Aortic Atherosclerosis (ICD10-I70.0). Coronary artery atherosclerosis. Electronically Signed   By: Sharlet Salina M.D.   On: 09/24/2022 19:44   DG Chest Port 1 View  Result Date: 09/24/2022 CLINICAL DATA:  Shortness of breath.  History of COPD emphysema. EXAM: PORTABLE CHEST 1 VIEW COMPARISON:  Chest radiographs 04/04/2018 and 07/24/2015 FINDINGS: Cardiac silhouette and mediastinal contours are within limits. There is again flattening of the diaphragms and moderate hyperinflation. There again lucencies within the upper lungs with attenuation of the pulmonary vasculature in keeping with chronic emphysematous changes. No acute airspace opacity. No pleural effusion pneumothorax. No acute skeletal abnormality. IMPRESSION: 1. No acute cardiopulmonary process. 2. Chronic emphysematous changes. Electronically Signed   By: Neita Garnet  M.D.   On: 09/24/2022 16:39    Microbiology: Results for orders placed or performed during the hospital encounter of 09/24/22  SARS Coronavirus 2 by RT PCR (hospital order, performed in [facility] hospital lab) *cepheid single result test* Anterior Nasal Swab     Status: None   Collection Time: 09/26/22  3:51 PM   Specimen: Anterior Nasal Swab  Result Value Ref Range Status   SARS Coronavirus 2 by RT PCR NEGATIVE NEGATIVE Final    Comment: (NOTE) SARS-CoV-2 target nucleic acids are NOT DETECTED.  The SARS-CoV-2 RNA is generally detectable in upper and lower respiratory specimens during the acute phase of infection. The lowest concentration of SARS-CoV-2 viral copies this assay can detect is 250 copies / mL. A negative result does not preclude SARS-CoV-2 infection and should not be used as the sole basis for treatment or other patient management decisions.  A negative result may occur with improper specimen collection / handling, submission of specimen other than nasopharyngeal swab, presence of viral mutation(s) within the areas targeted by this assay, and inadequate number of viral copies (<250 copies / mL). A negative result must be combined with clinical observations, patient history, and epidemiological information.  Fact Sheet for Patients:   RoadLapTop.co.za  Fact Sheet for Healthcare Providers: http://kim-miller.com/  This test is not yet approved or  cleared by the [place] FDA and has been authorized for detection and/or diagnosis of SARS-CoV-2 by FDA under an Emergency Use Authorization (EUA).  This EUA will remain in effect (meaning this test can be used) for the duration of the COVID-19 declaration under Section 564(b)(1) of the Act, 21 U.S.C. section 360bbb-3(b)(1), unless the authorization is terminated or revoked sooner.  Performed at [facility], [address] [place], [place] [address]   MRSA Next  Gen by PCR, Nasal     Status: None   Collection Time: 09/26/22  3:51 PM   Specimen: Anterior Nasal Swab  Result Value Ref Range Status   MRSA by PCR Next Gen NOT DETECTED NOT DETECTED Final    Comment: (NOTE) The GeneXpert MRSA Assay (FDA approved for NASAL specimens only), is one component of a comprehensive MRSA colonization surveillance program. It is not intended to diagnose MRSA infection nor to guide or monitor treatment for MRSA infections. Test performance is not FDA approved in patients less than 78 years old. Performed at [facility], [address] [place], [place] [address]   Respiratory (~20 pathogens) panel by PCR     Status: None   Collection Time: 09/26/22  4:10 PM  Result Value Ref Range Status   Adenovirus NOT DETECTED NOT DETECTED Final   Coronavirus 229E NOT DETECTED NOT DETECTED Final  Comment: (NOTE) The Coronavirus on the Respiratory Panel, DOES NOT test for the novel  Coronavirus (2019 nCoV)    Coronavirus HKU1 NOT DETECTED NOT DETECTED Final   Coronavirus NL63 NOT DETECTED NOT DETECTED Final   Coronavirus OC43 NOT DETECTED NOT DETECTED Final   Metapneumovirus NOT DETECTED NOT DETECTED Final   Rhinovirus / Enterovirus NOT DETECTED NOT DETECTED Final   Influenza A NOT DETECTED NOT DETECTED Final   Influenza B NOT DETECTED NOT DETECTED Final   Parainfluenza Virus 1 NOT DETECTED NOT DETECTED Final   Parainfluenza Virus 2 NOT DETECTED NOT DETECTED Final   Parainfluenza Virus 3 NOT DETECTED NOT DETECTED Final   Parainfluenza Virus 4 NOT DETECTED NOT DETECTED Final   Respiratory Syncytial Virus NOT DETECTED NOT DETECTED Final   Bordetella pertussis NOT DETECTED NOT DETECTED Final   Bordetella Parapertussis NOT DETECTED NOT DETECTED Final   Chlamydophila pneumoniae NOT DETECTED NOT DETECTED Final   Mycoplasma pneumoniae NOT DETECTED NOT DETECTED Final    Comment: Performed at [facility], [address], [place], [place] [address]   Expectorated Sputum Assessment w Gram Stain, Rflx to Resp Cult     Status: None   Collection Time: 09/30/22  6:32 PM   Specimen: Sputum  Result Value Ref Range Status   Specimen Description SPUTUM  Final   Special Requests EXPSU  Final   Sputum evaluation   Final    THIS SPECIMEN IS ACCEPTABLE FOR SPUTUM CULTURE Performed at [facility], [address], [place], [place] [address]    Report Status 09/30/2022 FINAL  Final  Culture, Respiratory w Gram Stain     Status: None   Collection Time: 09/30/22  6:32 PM   Specimen: SPU  Result Value Ref Range Status   Specimen Description   Final    SPUTUM Performed at [facility], [address], [place], [place] [address]    Special Requests   Final    EXPSU Reflexed from [phone] Performed at [facility], [address] [place], [place] [address]    Gram Stain   Final    FEW WBC PRESENT, PREDOMINANTLY PMN RARE GRAM POSITIVE COCCI    Culture   Final    Normal respiratory flora-no Staph aureus or Pseudomonas seen Performed at [facility], [address] [place], [place] [address]    Report Status 10/03/2022 FINAL  Final    Labs: CBC: Recent Labs  Lab 09/27/22 0426 09/28/22 0622 09/29/22 0509 09/30/22 0554 10/01/22 0504 10/02/22 0341  WBC 12.4* 14.8* 13.4* 14.2* 13.7* 14.1*  NEUTROABS 10.9*  --   --   --   --   --   HGB 14.6 15.4 15.7 15.8 14.6 13.9  HCT 42.9 46.1 48.2 48.1 44.6 42.2  MCV 88.3 89.7 91.5 90.8 90.7 89.4  PLT 227 280 284 285 262 225   Basic Metabolic Panel: Recent Labs  Lab 09/28/22 0622 09/29/22 0509 09/30/22 0554 10/01/22 0504 10/02/22 0341 10/03/22 0452  NA 137 138 136 140 139 141  K 4.7 4.9 4.7 3.9 3.9 4.1  CL 97* 97* 96* 99 100 102  CO2 31 33* 33* 33* 32 31  GLUCOSE 122* 135* 113* 97 94 120*  BUN 24* 24* 24* 27* 25* 25*  CREATININE 0.81 0.90 0.92 0.81 0.70 0.83  CALCIUM 9.1 9.2 9.2 8.4* 8.3* 8.9  MG 2.5* 2.5* 2.3 2.1 2.2  --    Liver Function  Tests: Recent Labs  Lab 09/27/22 0426  AST 20  ALT 19  ALKPHOS 58  BILITOT 0.4  PROT 6.6  ALBUMIN 3.4*   CBG: Recent Labs  Lab 10/02/22 0726 10/02/22 1129 10/02/22 1611 10/02/22 2126 10/03/22 0856  GLUCAP 82 191* 147* 150* 73    Discharge time spent: greater than 30 minutes.  Signed: Delfino Lovett, MD [facility] 10/03/2022

## 2022-10-03 NOTE — Progress Notes (Signed)
Occupational Therapy * Physical Therapy * Speech Therapy  DATE 10/03/22 PATIENT NAME Bruce Harding PATIENT MRN [id number]  DIAGNOSIS/DIAGNOSIS CODE J96.01 DATE OF DISCHARGE 10/03/22  PRIMARY CARE PHYSICIAN Dr. Butler Denmark PCP PHONE/FAX [phone]   Dear Provider (Name: [facility] OPPT Fax: [phone]):   I certify that I have examined this patient and that occupational/physical/speech therapy is necessary on an outpatient basis.    The patient has expressed interest in completing their recommended course of therapy at your location.  Once a formal order from the patient's primary care physician has been obtained, please contact him/her to schedule an appointment for evaluation at your earliest convenience.  [ x ]  Physical Therapy Evaluate and Treat  The patient's primary care physician (listed above) must furnish and be responsible for a formal order such that the recommended services may be furnished while under the primary physician's care, and that the plan of care will be established and reviewed every 30 days (or more often if condition necessitates).

## 2022-10-03 NOTE — TOC Transition Note (Addendum)
Transition of Care [facility]) - CM/SW Discharge Note   Patient Details  Name: Bruce Harding MRN: [id number] Date of Birth: 09/12/1960  [facility] CM/SW Contact:  Liliana Cline, LCSW Phone Number: 10/03/2022, 9:21 AM   Clinical Narrative:    Patient to DC home today. Needs home o2 per MD. O2 referral made to [facility] with [employer] who is aware of DC home today pending delivery. Spoke to patient's spouse, explained no HH agency was able to accept patient. TOC has reached out to [employer], [facility], [facility], [facility], [facility] - all have declined. Patient's spouse is agreeable to OPPT and she can transport patient. She chose [facility]. Referral sent.   11:00- Patient asking for platform walker. He is agreeable to home delivery and knows it may take up to 2 days. Notified Kim with [employer] who confirms they will order platform RW for home delivery.    Final next level of care: OP Rehab Barriers to Discharge: Barriers Resolved   Patient Goals and CMS Choice CMS Medicare.gov Compare Post Acute Care list provided to:: Patient Represenative (must comment) Choice offered to / list presented to : Spouse  Discharge Placement                  Patient to be transferred to facility by: wife to transport home. Name of family member notified: Steward Drone Patient and family notified of of transfer: 10/03/22  Discharge Plan and Services Additional resources added to the After Visit Summary for       Post Acute Care Choice: Home Health          DME Arranged: Oxygen DME Agency: [employer] Date DME Agency Contacted: 10/03/22   Representative spoke with at DME Agency: Selena Batten            Social Determinants of Health (SDOH) Interventions SDOH Screenings   Food Insecurity: No Food Insecurity (09/24/2022)  Housing: Low Risk  (09/24/2022)  Transportation Needs: No Transportation Needs (09/24/2022)  Utilities: Not At Risk (09/24/2022)  Tobacco Use: Medium  Risk (09/24/2022)  Health Literacy: Medium Risk (08/17/2019)   Received from [facility]     Readmission Risk Interventions     No data to display

## 2022-10-03 NOTE — Progress Notes (Signed)
Palliative consult received.  Primary team informed of anticipated discharge today with no need for Palliative engagement at this time. Orders placed at discharge for outpatient Palliative care referral.  No Charge.  Leeanne Deed, DNP, AGNP-C Palliative Medicine  Please call Palliative Medicine team phone with any questions [phone]. For individual providers please see AMION.

## 2023-02-24 DIAGNOSIS — M65311 Trigger thumb, right thumb: Secondary | ICD-10-CM

## 2023-02-24 HISTORY — DX: Trigger thumb, right thumb: M65.311

## 2023-03-15 ENCOUNTER — Other Ambulatory Visit: Payer: Self-pay | Admitting: Surgery

## 2023-03-16 ENCOUNTER — Encounter
Admission: RE | Admit: 2023-03-16 | Discharge: 2023-03-16 | Disposition: A | Discharge status: Home / Self Care | Payer: Medicaid Other | Source: Ambulatory Visit | Attending: Surgery | Admitting: Surgery

## 2023-03-16 ENCOUNTER — Other Ambulatory Visit: Payer: Self-pay

## 2023-03-16 VITALS — Ht 65.0 in | Wt 179.0 lb

## 2023-03-16 DIAGNOSIS — J441 Chronic obstructive pulmonary disease with (acute) exacerbation: Secondary | ICD-10-CM

## 2023-03-16 DIAGNOSIS — I10 Essential (primary) hypertension: Secondary | ICD-10-CM

## 2023-03-16 DIAGNOSIS — Z01812 Encounter for preprocedural laboratory examination: Secondary | ICD-10-CM

## 2023-03-16 DIAGNOSIS — Z01810 Encounter for preprocedural cardiovascular examination: Secondary | ICD-10-CM

## 2023-03-16 HISTORY — DX: Spondylosis without myelopathy or radiculopathy, lumbar region: M47.816

## 2023-03-16 HISTORY — DX: Atherosclerotic heart disease of native coronary artery without angina pectoris: I25.10

## 2023-03-16 HISTORY — DX: Atherosclerosis of aorta: I70.0

## 2023-03-16 HISTORY — DX: Ganglion, right wrist: M67.431

## 2023-03-16 HISTORY — DX: Carpal tunnel syndrome, right upper limb: G56.01

## 2023-03-16 NOTE — Patient Instructions (Addendum)
Your procedure is scheduled on: Wednesday, January 29 Report to the Registration Desk on the 1st floor of the [facility] To find out your arrival time, please call [phone] between 1PM - 3PM on: Tuesday, January 28 If your arrival time is 6:00 am, do not arrive before that time as the [facility] entrance doors do not open until 6:00 am.  REMEMBER: Instructions that are not followed completely may result in serious medical risk, up to and including death; or upon the discretion of your surgeon and anesthesiologist your surgery may need to be rescheduled.  Do not eat food after midnight the night before surgery.  No gum chewing or hard candies.  You may however, drink CLEAR liquids up to 2 hours before you are scheduled to arrive for your surgery. Do not drink anything within 2 hours of your scheduled arrival time.  Clear liquids include: - water  - apple juice without pulp - gatorade (not RED colors) - black coffee or tea (Do NOT add milk or creamers to the coffee or tea) Do NOT drink anything that is not on this list.  In addition, your doctor has ordered for you to drink the provided:  Ensure Pre-Surgery Clear Carbohydrate Drink  Drinking this carbohydrate drink up to two hours before surgery helps to reduce insulin resistance and improve patient outcomes. Please complete drinking 2 hours before scheduled arrival time.  One week prior to surgery: starting January 22 Stop Anti-inflammatories (NSAIDS) such as Advil, Aleve, Ibuprofen, Motrin, Naproxen, Naprosyn and Aspirin based products such as Excedrin, Goody's Powder, BC Powder. Stop ANY OVER THE COUNTER supplements until after surgery.  You may however, continue to take Tylenol if needed for pain up until the day of surgery.  Continue taking all of your other prescription medications up until the day of surgery.  ON THE DAY OF SURGERY ONLY TAKE THESE MEDICATIONS WITH SIPS OF WATER:  ipratropium-albuterol (DUONEB)  nebulizer busPIRone (BUSPAR)  fluticasone-salmeterol (ADVAIR) inhaler gabapentin (NEURONTIN) oxyCODONE if needed for pain tiotropium (SPIRIVA) inhaler  Use inhalers on the day of surgery and bring your albuterol inhaler to the hospital.  No Alcohol for 24 hours before or after surgery.  No Smoking including e-cigarettes for 24 hours before surgery.  No chewable tobacco products for at least 6 hours before surgery.  No nicotine patches on the day of surgery.  Do not use any "recreational" drugs for at least a week (preferably 2 weeks) before your surgery.  Please be advised that the combination of cocaine and anesthesia may have negative outcomes, up to and including death. If you test positive for cocaine, your surgery will be cancelled.  On the morning of surgery brush your teeth with toothpaste and water, you may rinse your mouth with mouthwash if you wish. Do not swallow any toothpaste or mouthwash.  Use CHG Soap as directed on instruction sheet.  Do not wear jewelry, make-up, hairpins, clips or nail polish.  For welded (permanent) jewelry: bracelets, anklets, waist bands, etc.  Please have this removed prior to surgery.  If it is not removed, there is a chance that hospital personnel will need to cut it off on the day of surgery.  Do not wear lotions, powders, or perfumes.   Do not shave body hair from the neck down 48 hours before surgery.  Contact lenses, hearing aids and dentures may not be worn into surgery.  Do not bring valuables to the hospital. [facility] is not responsible for any missing/lost belongings or  valuables.   Notify your doctor if there is any change in your medical condition (cold, fever, infection).  Wear comfortable clothing (specific to your surgery type) to the hospital.  After surgery, you can help prevent lung complications by doing breathing exercises.  Take deep breaths and cough every 1-2 hours. Your doctor may order a device called an  Incentive Spirometer to help you take deep breaths.  If you are being discharged the day of surgery, you will not be allowed to drive home. You will need a responsible individual to drive you home and stay with you for 24 hours after surgery.   If you are taking public transportation, you will need to have a responsible individual with you.  Please call the Pre-admissions Testing Dept. at [phone] if you have any questions about these instructions.  Surgery Visitation Policy:  Patients having surgery or a procedure may have two visitors.  Children under the age of 64 must have an adult with them who is not the patient.  Temporary Visitor Restrictions Due to increasing cases of flu, RSV and COVID-19: Children ages 67 and under will not be able to visit patients in [facility] hospitals under most circumstances.       Preparing for Surgery with CHLORHEXIDINE GLUCONATE (CHG) Soap  Chlorhexidine Gluconate (CHG) Soap  o An antiseptic cleaner that kills germs and bonds with the skin to continue killing germs even after washing  o Used for showering the night before surgery and morning of surgery  Before surgery, you can play an important role by reducing the number of germs on your skin.  CHG (Chlorhexidine gluconate) soap is an antiseptic cleanser which kills germs and bonds with the skin to continue killing germs even after washing.  Please do not use if you have an allergy to CHG or antibacterial soaps. If your skin becomes reddened/irritated stop using the CHG.  1. Shower the NIGHT BEFORE SURGERY and the MORNING OF SURGERY with CHG soap.  2. If you choose to wash your hair, wash your hair first as usual with your normal shampoo.  3. After shampooing, rinse your hair and body thoroughly to remove the shampoo.  4. Use CHG as you would any other liquid soap. You can apply CHG directly to the skin and wash gently with a scrungie or a clean washcloth.  5. Apply the CHG soap  to your body only from the neck down. Do not use on open wounds or open sores. Avoid contact with your eyes, ears, mouth, and genitals (private parts). Wash face and genitals (private parts) with your normal soap.  6. Wash thoroughly, paying special attention to the area where your surgery will be performed.  7. Thoroughly rinse your body with warm water.  8. Do not shower/wash with your normal soap after using and rinsing off the CHG soap.  9. Pat yourself dry with a clean towel.  10. Wear clean pajamas to bed the night before surgery.  12. Place clean sheets on your bed the night of your first shower and do not sleep with pets.  13. Shower again with the CHG soap on the day of surgery prior to arriving at the hospital.  14. Do not apply any deodorants/lotions/powders.  15. Please wear clean clothes to the hospital.

## 2023-03-17 ENCOUNTER — Ambulatory Visit
Admission: RE | Admit: 2023-03-17 | Discharge: 2023-03-17 | Disposition: A | Discharge status: Home / Self Care | Payer: Medicaid Other | Source: Ambulatory Visit | Attending: Surgery | Admitting: Surgery

## 2023-03-17 DIAGNOSIS — J441 Chronic obstructive pulmonary disease with (acute) exacerbation: Secondary | ICD-10-CM | POA: Diagnosis not present

## 2023-03-17 DIAGNOSIS — Z01818 Encounter for other preprocedural examination: Secondary | ICD-10-CM | POA: Insufficient documentation

## 2023-03-17 DIAGNOSIS — Z01810 Encounter for preprocedural cardiovascular examination: Secondary | ICD-10-CM | POA: Diagnosis not present

## 2023-03-17 DIAGNOSIS — Z01812 Encounter for preprocedural laboratory examination: Secondary | ICD-10-CM

## 2023-03-17 DIAGNOSIS — I10 Essential (primary) hypertension: Secondary | ICD-10-CM | POA: Diagnosis not present

## 2023-03-17 LAB — BASIC METABOLIC PANEL
Anion gap: 9 (ref 5–15)
BUN: 13 mg/dL (ref 8–23)
CO2: 25 mmol/L (ref 22–32)
Calcium: 8.5 mg/dL — ABNORMAL LOW (ref 8.9–10.3)
Chloride: 104 mmol/L (ref 98–111)
Creatinine, Ser: 0.89 mg/dL (ref 0.61–1.24)
GFR, Estimated: >60 mL/min (ref >60)
Glucose, Bld: 88 mg/dL (ref 70–99)
Potassium: 4 mmol/L (ref 3.5–5.1)
Sodium: 138 mmol/L (ref 135–145)

## 2023-03-17 LAB — CBC
HCT: 42.1 % (ref 39.0–52.0)
Hemoglobin: 14.4 g/dL (ref 13.0–17.0)
MCH: 29.3 pg (ref 26.0–34.0)
MCHC: 34.2 g/dL (ref 30.0–36.0)
MCV: 85.6 fL (ref 80.0–100.0)
Platelets: 238 10*3/uL (ref 150–400)
RBC: 4.92 MIL/uL (ref 4.22–5.81)
RDW: 11.9 % (ref 11.5–15.5)
WBC: 8.1 10*3/uL (ref 4.0–10.5)
nRBC: 0 % (ref 0.0–0.2)

## 2023-03-23 MED ORDER — CEFAZOLIN SODIUM-DEXTROSE 2-4 GM/100ML-% IV SOLN
2.0000 g | INTRAVENOUS | Status: AC
  Administered 2023-03-24: 2 g via INTRAVENOUS

## 2023-03-23 MED ORDER — ORAL CARE MOUTH RINSE: 15.0000 mL | Freq: Once | OROMUCOSAL | Status: AC

## 2023-03-23 MED ORDER — LACTATED RINGERS IV SOLN
INTRAVENOUS | Status: DC
Start: 2023-03-23 — End: 2023-03-24

## 2023-03-23 MED ORDER — CHLORHEXIDINE GLUCONATE 0.12 % MT SOLN
15.0000 mL | Freq: Once | OROMUCOSAL | Status: AC
  Administered 2023-03-24: 15 mL via OROMUCOSAL

## 2023-03-24 ENCOUNTER — Ambulatory Visit
Admission: RE | Admit: 2023-03-24 | Discharge: 2023-03-24 | Disposition: A | Discharge status: Home / Self Care | Payer: Medicaid Other | Attending: Surgery | Admitting: Surgery

## 2023-03-24 ENCOUNTER — Encounter
Admission: RE | Disposition: A | Discharge status: Home / Self Care | Payer: Self-pay | Source: Home / Self Care | Attending: Surgery

## 2023-03-24 ENCOUNTER — Ambulatory Visit: Payer: Medicaid Other | Admitting: Certified Registered"

## 2023-03-24 ENCOUNTER — Other Ambulatory Visit: Payer: Self-pay

## 2023-03-24 ENCOUNTER — Encounter: Payer: Self-pay | Admitting: Surgery

## 2023-03-24 ENCOUNTER — Ambulatory Visit: Payer: Medicaid Other | Admitting: Urgent Care

## 2023-03-24 DIAGNOSIS — Z7951 Long term (current) use of inhaled steroids: Secondary | ICD-10-CM | POA: Diagnosis not present

## 2023-03-24 DIAGNOSIS — Z87891 Personal history of nicotine dependence: Secondary | ICD-10-CM | POA: Insufficient documentation

## 2023-03-24 DIAGNOSIS — Z79899 Other long term (current) drug therapy: Secondary | ICD-10-CM | POA: Insufficient documentation

## 2023-03-24 DIAGNOSIS — G8929 Other chronic pain: Secondary | ICD-10-CM | POA: Insufficient documentation

## 2023-03-24 DIAGNOSIS — E119 Type 2 diabetes mellitus without complications: Secondary | ICD-10-CM | POA: Insufficient documentation

## 2023-03-24 DIAGNOSIS — I10 Essential (primary) hypertension: Secondary | ICD-10-CM | POA: Insufficient documentation

## 2023-03-24 DIAGNOSIS — I2510 Atherosclerotic heart disease of native coronary artery without angina pectoris: Secondary | ICD-10-CM | POA: Diagnosis not present

## 2023-03-24 DIAGNOSIS — R519 Headache, unspecified: Secondary | ICD-10-CM | POA: Diagnosis not present

## 2023-03-24 DIAGNOSIS — Z9981 Dependence on supplemental oxygen: Secondary | ICD-10-CM | POA: Insufficient documentation

## 2023-03-24 DIAGNOSIS — M65311 Trigger thumb, right thumb: Secondary | ICD-10-CM | POA: Diagnosis present

## 2023-03-24 DIAGNOSIS — Z7952 Long term (current) use of systemic steroids: Secondary | ICD-10-CM | POA: Diagnosis not present

## 2023-03-24 DIAGNOSIS — G4730 Sleep apnea, unspecified: Secondary | ICD-10-CM | POA: Insufficient documentation

## 2023-03-24 DIAGNOSIS — J449 Chronic obstructive pulmonary disease, unspecified: Secondary | ICD-10-CM | POA: Diagnosis not present

## 2023-03-24 DIAGNOSIS — R0602 Shortness of breath: Secondary | ICD-10-CM | POA: Diagnosis not present

## 2023-03-24 HISTORY — PX: TRIGGER FINGER RELEASE: SHX641

## 2023-03-24 SURGERY — RELEASE, A1 PULLEY, FOR TRIGGER FINGER
Anesthesia: Monitor Anesthesia Care | Site: Thumb | Laterality: Right

## 2023-03-24 MED ORDER — BUPIVACAINE HCL (PF) 0.5 % IJ SOLN
INTRAMUSCULAR | Status: DC | PRN
  Administered 2023-03-24: 5 mL

## 2023-03-24 MED ORDER — PHENYLEPHRINE 80 MCG/ML (10ML) SYRINGE FOR IV PUSH (FOR BLOOD PRESSURE SUPPORT)
PREFILLED_SYRINGE | INTRAVENOUS | Status: DC | PRN
  Administered 2023-03-24 (×2): 80 ug via INTRAVENOUS

## 2023-03-24 MED ORDER — OXYCODONE HCL 5 MG/5ML PO SOLN: 5.0000 mg | Freq: Once | ORAL | Status: AC | PRN

## 2023-03-24 MED ORDER — PROPOFOL 10 MG/ML IV BOLUS
INTRAVENOUS | Status: DC | PRN
  Administered 2023-03-24: 20 mg via INTRAVENOUS
  Administered 2023-03-24: 120 mg via INTRAVENOUS
  Administered 2023-03-24: 20 mg via INTRAVENOUS

## 2023-03-24 MED ORDER — ACETAMINOPHEN 10 MG/ML IV SOLN
INTRAVENOUS | Status: DC | PRN
  Administered 2023-03-24: 1000 mg via INTRAVENOUS

## 2023-03-24 MED ORDER — DULOXETINE HCL 30 MG PO CPEP: 30.0000 mg | ORAL_CAPSULE | Freq: Every day | ORAL | 0 refills | Status: DC

## 2023-03-24 MED ORDER — ONDANSETRON HCL 4 MG/2ML IJ SOLN
INTRAMUSCULAR | Status: DC | PRN
  Administered 2023-03-24: 4 mg via INTRAVENOUS

## 2023-03-24 MED ORDER — OXYCODONE HCL 5 MG PO TABS
ORAL_TABLET | ORAL | Status: AC
  Filled 2023-03-24: qty 1

## 2023-03-24 MED ORDER — ACETAMINOPHEN 10 MG/ML IV SOLN: 1000.0000 mg | Freq: Once | INTRAVENOUS | Status: DC | PRN

## 2023-03-24 MED ORDER — FENTANYL CITRATE (PF) 100 MCG/2ML IJ SOLN
INTRAMUSCULAR | Status: AC
  Filled 2023-03-24: qty 2

## 2023-03-24 MED ORDER — OXYCODONE HCL 5 MG PO TABS: 5.0000 mg | ORAL_TABLET | ORAL | 0 refills | Status: DC | PRN

## 2023-03-24 MED ORDER — MIDAZOLAM HCL 2 MG/2ML IJ SOLN
INTRAMUSCULAR | Status: AC
  Filled 2023-03-24: qty 2

## 2023-03-24 MED ORDER — METOCLOPRAMIDE HCL 5 MG/ML IJ SOLN: 5.0000 mg | Freq: Three times a day (TID) | INTRAMUSCULAR | Status: DC | PRN

## 2023-03-24 MED ORDER — METOCLOPRAMIDE HCL 10 MG PO TABS: 5.0000 mg | ORAL_TABLET | Freq: Three times a day (TID) | ORAL | Status: DC | PRN

## 2023-03-24 MED ORDER — KETOROLAC TROMETHAMINE 15 MG/ML IJ SOLN
15.0000 mg | Freq: Once | INTRAMUSCULAR | Status: AC
  Administered 2023-03-24: 15 mg via INTRAVENOUS

## 2023-03-24 MED ORDER — OXYCODONE HCL 5 MG PO TABS: 5.0000 mg | ORAL_TABLET | ORAL | Status: DC | PRN

## 2023-03-24 MED ORDER — CEFAZOLIN SODIUM-DEXTROSE 2-4 GM/100ML-% IV SOLN
INTRAVENOUS | Status: AC
  Filled 2023-03-24: qty 100

## 2023-03-24 MED ORDER — DROPERIDOL 2.5 MG/ML IJ SOLN: 0.6250 mg | Freq: Once | INTRAMUSCULAR | Status: DC | PRN

## 2023-03-24 MED ORDER — ONDANSETRON HCL 4 MG/2ML IJ SOLN
4.0000 mg | Freq: Four times a day (QID) | INTRAMUSCULAR | Status: DC | PRN
Start: 2023-03-24 — End: 2023-03-24

## 2023-03-24 MED ORDER — BUPIVACAINE HCL (PF) 0.5 % IJ SOLN
INTRAMUSCULAR | Status: AC
  Filled 2023-03-24: qty 30

## 2023-03-24 MED ORDER — DEXTROSE-SODIUM CHLORIDE 5-0.9 % IV SOLN: INTRAVENOUS | Status: DC

## 2023-03-24 MED ORDER — FENTANYL CITRATE (PF) 100 MCG/2ML IJ SOLN: 25.0000 ug | INTRAMUSCULAR | Status: DC | PRN

## 2023-03-24 MED ORDER — ONDANSETRON HCL 4 MG PO TABS: 4.0000 mg | ORAL_TABLET | Freq: Four times a day (QID) | ORAL | Status: DC | PRN

## 2023-03-24 MED ORDER — ACETAMINOPHEN 325 MG PO TABS: 325.0000 mg | ORAL_TABLET | Freq: Four times a day (QID) | ORAL | Status: DC | PRN

## 2023-03-24 MED ORDER — ACETAMINOPHEN 10 MG/ML IV SOLN
INTRAVENOUS | Status: AC
  Filled 2023-03-24: qty 100

## 2023-03-24 MED ORDER — KETOROLAC TROMETHAMINE 15 MG/ML IJ SOLN
INTRAMUSCULAR | Status: AC
  Filled 2023-03-24: qty 1

## 2023-03-24 MED ORDER — OXYCODONE HCL 5 MG PO TABS
5.0000 mg | ORAL_TABLET | Freq: Once | ORAL | Status: AC | PRN
  Administered 2023-03-24: 5 mg via ORAL

## 2023-03-24 MED ORDER — CHLORHEXIDINE GLUCONATE 0.12 % MT SOLN
OROMUCOSAL | Status: AC
  Filled 2023-03-24: qty 15

## 2023-03-24 MED ORDER — 0.9 % SODIUM CHLORIDE (POUR BTL) OPTIME
TOPICAL | Status: DC | PRN
  Administered 2023-03-24: 500 mL

## 2023-03-24 MED ORDER — MIDAZOLAM HCL 2 MG/2ML IJ SOLN
INTRAMUSCULAR | Status: DC | PRN
  Administered 2023-03-24: 2 mg via INTRAVENOUS

## 2023-03-24 MED ORDER — PHENYLEPHRINE 80 MCG/ML (10ML) SYRINGE FOR IV PUSH (FOR BLOOD PRESSURE SUPPORT)
PREFILLED_SYRINGE | INTRAVENOUS | Status: AC
  Filled 2023-03-24: qty 10

## 2023-03-24 MED ORDER — FENTANYL CITRATE (PF) 100 MCG/2ML IJ SOLN
INTRAMUSCULAR | Status: DC | PRN
  Administered 2023-03-24: 25 ug via INTRAVENOUS
  Administered 2023-03-24: 50 ug via INTRAVENOUS

## 2023-03-24 SURGICAL SUPPLY — 29 items
BNDG COHESIVE 4X5 TAN STRL LF (GAUZE/BANDAGES/DRESSINGS) ×1 IMPLANT
BNDG ESMARCH 4X12 STRL LF (GAUZE/BANDAGES/DRESSINGS) ×1 IMPLANT
BNDG STRETCH GAUZE 3IN X12FT (GAUZE/BANDAGES/DRESSINGS) ×1 IMPLANT
CHLORAPREP W/TINT 26 (MISCELLANEOUS) ×1 IMPLANT
CORD BIP STRL DISP 12FT (MISCELLANEOUS) IMPLANT
CUFF TOURN SGL QUICK 18X4 (TOURNIQUET CUFF) IMPLANT
DRAPE FLUOR MINI C-ARM 54X84 (DRAPES) ×1 IMPLANT
FORCEPS JEWEL BIP 4-3/4 STR (INSTRUMENTS) IMPLANT
GAUZE SPONGE 4X4 12PLY STRL (GAUZE/BANDAGES/DRESSINGS) ×1 IMPLANT
GAUZE XEROFORM 1X8 LF (GAUZE/BANDAGES/DRESSINGS) ×1 IMPLANT
GLOVE BIO SURGEON STRL SZ8 (GLOVE) ×2 IMPLANT
GLOVE BIOGEL PI IND STRL 8 (GLOVE) ×1 IMPLANT
GLOVE INDICATOR 8.0 STRL GRN (GLOVE) ×1 IMPLANT
GOWN STRL REUS W/ TWL LRG LVL3 (GOWN DISPOSABLE) ×1 IMPLANT
GOWN STRL REUS W/ TWL XL LVL3 (GOWN DISPOSABLE) ×1 IMPLANT
KIT TURNOVER KIT A (KITS) ×1 IMPLANT
MANIFOLD NEPTUNE II (INSTRUMENTS) ×1 IMPLANT
NDL HYPO 25X1 1.5 SAFETY (NEEDLE) ×1 IMPLANT
NEEDLE HYPO 25X1 1.5 SAFETY (NEEDLE) ×1
NS IRRIG 500ML POUR BTL (IV SOLUTION) ×1 IMPLANT
PACK EXTREMITY ARMC (MISCELLANEOUS) ×1 IMPLANT
SPLINT WRIST LG RT TX900304 (SOFTGOODS) IMPLANT
SPLINT WRIST M LT TX990308 (SOFTGOODS) ×1 IMPLANT
STOCKINETTE IMPERV 14X48 (MISCELLANEOUS) ×1 IMPLANT
STOCKINETTE IMPERVIOUS 9X36 MD (GAUZE/BANDAGES/DRESSINGS) ×1 IMPLANT
SUT PROLENE 4 0 PS 2 18 (SUTURE) IMPLANT
SYR 10ML LL (SYRINGE) ×1 IMPLANT
TRAP FLUID SMOKE EVACUATOR (MISCELLANEOUS) ×1 IMPLANT
WATER STERILE IRR 500ML POUR (IV SOLUTION) ×1 IMPLANT

## 2023-03-24 NOTE — Anesthesia Procedure Notes (Signed)
Procedure Name: LMA Insertion Date/Time: 03/24/2023 11:12 AM  Performed by: Maryla Morrow., CRNAPre-anesthesia Checklist: Patient identified, Patient being monitored, Timeout performed, Emergency Drugs available and Suction available Patient Re-evaluated:Patient Re-evaluated prior to induction Oxygen Delivery Method: Circle system utilized Preoxygenation: Pre-oxygenation with 100% oxygen Induction Type: IV induction Ventilation: Mask ventilation without difficulty LMA: LMA inserted LMA Size: 5.0 Tube type: Oral Number of attempts: 1 Placement Confirmation: positive ETCO2 and breath sounds checked- equal and bilateral Tube secured with: Tape Dental Injury: Teeth and Oropharynx as per pre-operative assessment

## 2023-03-24 NOTE — Anesthesia Postprocedure Evaluation (Signed)
Anesthesia Post Note  Patient: AIKEEM LILLEY  Procedure(s) Performed: RELEASE TRIGGER FINGER/A-1 PULLEY (Right: Thumb)  Patient location during evaluation: PACU Anesthesia Type: MAC Level of consciousness: awake and alert Pain management: pain level controlled Vital Signs Assessment: post-procedure vital signs reviewed and stable Respiratory status: spontaneous breathing, nonlabored ventilation, respiratory function stable and patient connected to nasal cannula oxygen Cardiovascular status: blood pressure returned to baseline and stable Postop Assessment: no apparent nausea or vomiting Anesthetic complications: no   There were no known notable events for this encounter.   Last Vitals:  Vitals:   03/24/23 0958 03/24/23 1149  BP: (!) 145/83 101/72  Pulse: 85 68  Resp: 18 13  Temp: (!) 36.3 C (!) 36.2 C  SpO2: 96% 99%    Last Pain:  Vitals:   03/24/23 1149  TempSrc:   PainSc: Asleep                 Yevette Edwards

## 2023-03-24 NOTE — Op Note (Signed)
03/24/2023  11:45 AM  Patient:   Bruce Harding  Pre-Op Diagnosis:   Right trigger thumb.  Post-Op Diagnosis:   Same  Procedure:   Release right trigger thumb.  Surgeon:   Maryagnes Amos, MD  Assistant:   None  Anesthesia:   General LMA  Findings:   As above.  Complications:   None  EBL:   0 cc  Fluids:   300 cc crystalloid  TT:   13 minutes at 250 mmHg  Drains:   None  Closure:   4-0 Prolene  Implants:   None  Brief Clinical Note:   The patient is a 63 year old male with a history of progressively worsening painful catching of his right thumb. These symptoms have progressed despite medications, activity modification, etc. The patient's history and examination are consistent with a right trigger thumb. The patient presents at this time for release of his right trigger thumb.  Procedure:   The patient was brought into the operating room and lain in the supine position. After adequate general laryngeal mask anesthesia was achieved, the right hand and upper extremity were prepped with ChloraPrep solution before being draped sterilely. Preoperative antibiotics were administered. A timeout was performed to verify the appropriate surgical site before the limb was exsanguinated with an Esmarch and the tourniquet inflated to 250 mmHg.  An approximately 1.5-2.0 cm incision was made over the volar aspect of the right thumb at the level of the metacarpal head centered over the flexor sheath. The incision was carried down through the subcutaneous tissues with care taken to identify and protect the digital neurovascular structures. The flexor sheath was entered just proximal to the A1 pulley. The sheath was released proximally for several centimeters under direct visualization. Distally, a clamp was placed beneath the A1 pulley and used to release any adhesions. The clamp was repositioned so that one jaw was superficial to and the other jaw deep to the A1 pulley. The A1 pulley was incised  on either side of the clamp to remove a 2 mm strip of tissue. Metzenbaum scissors were used to ensure complete release of the A1 pulley more distally. The underlying tendon was carefully inspected and found to be intact.   The wound was copiously irrigated with sterile saline solution before the wound was closed using 4-0 Prolene interrupted sutures. A total of 5 cc of 0.5% plain Sensorcaine was injected in and around the incision to help with postoperative analgesia before a sterile bulky dressing was applied to the hand. The patient was then awakened, extubated, and returned to the recovery room in satisfactory condition after tolerating the procedure well.

## 2023-03-24 NOTE — Discharge Instructions (Addendum)
Orthopedic discharge instructions: Keep dressing dry and intact. Keep hand elevated above heart level. May shower after dressing removed on postop day 4 (Sunday). Cover sutures with Band-Aids after drying off. Apply ice to affected area frequently. Take ibuprofen 600-800 mg TID with meals for 7-10 days, then as necessary. Take ES Tylenol or pain medication as prescribed when needed.  Return for follow-up in 10-14 days or as scheduled.
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
## 2023-03-24 NOTE — Anesthesia Preprocedure Evaluation (Signed)
Anesthesia Evaluation  Patient identified by MRN, date of birth, ID band Patient awake    Reviewed: Allergy & Precautions, H&P , NPO status , Patient's Chart, lab work & pertinent test results, reviewed documented beta blocker date and time   Airway Mallampati: II  TM Distance: >3 FB Neck ROM: full    Dental no notable dental hx. (+) Teeth Intact   Pulmonary shortness of breath, with exertion and Long-Term Oxygen Therapy, asthma , sleep apnea , COPD, former smoker   Pulmonary exam normal breath sounds clear to auscultation       Cardiovascular Exercise Tolerance: Poor hypertension, On Medications + CAD   Rhythm:regular Rate:Normal     Neuro/Psych  Headaches PSYCHIATRIC DISORDERS Anxiety Depression    TIA Neuromuscular disease    GI/Hepatic negative GI ROS, Neg liver ROS,,,  Endo/Other  negative endocrine ROSdiabetes, Well Controlled    Renal/GU      Musculoskeletal   Abdominal   Peds  Hematology negative hematology ROS (+)   Anesthesia Other Findings   Reproductive/Obstetrics negative OB ROS                             Anesthesia Physical Anesthesia Plan  ASA: 4  Anesthesia Plan: MAC   Post-op Pain Management:    Induction:   PONV Risk Score and Plan: 2  Airway Management Planned:   Additional Equipment:   Intra-op Plan:   Post-operative Plan:   Informed Consent: I have reviewed the patients History and Physical, chart, labs and discussed the procedure including the risks, benefits and alternatives for the proposed anesthesia with the patient or authorized representative who has indicated his/her understanding and acceptance.       Plan Discussed with: CRNA  Anesthesia Plan Comments:        Anesthesia Quick Evaluation

## 2023-03-24 NOTE — Transfer of Care (Signed)
Immediate Anesthesia Transfer of Care Note  Patient: Bruce Harding  Procedure(s) Performed: RELEASE TRIGGER FINGER/A-1 PULLEY (Right: Thumb)  Patient Location: PACU  Anesthesia Type:General  Level of Consciousness: drowsy and patient cooperative  Airway & Oxygen Therapy: Patient Spontanous Breathing and Patient connected to nasal cannula oxygen  Post-op Assessment: Report given to RN and Post -op Vital signs reviewed and stable  Post vital signs: stable  Last Vitals:  Vitals Value Taken Time  BP    Temp    Pulse    Resp    SpO2      Last Pain:  Vitals:   03/24/23 0958  TempSrc: Temporal  PainSc: 7          Complications: No notable events documented.

## 2023-03-25 ENCOUNTER — Encounter: Payer: Self-pay | Admitting: Surgery
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
## 2023-05-12 ENCOUNTER — Ambulatory Visit: Admitting: Neurosurgery

## 2023-05-12 ENCOUNTER — Ambulatory Visit
Admission: RE | Admit: 2023-05-12 | Discharge: 2023-05-12 | Disposition: A | Discharge status: Home / Self Care | Attending: Neurosurgery | Admitting: Neurosurgery

## 2023-05-12 ENCOUNTER — Ambulatory Visit
Admission: RE | Admit: 2023-05-12 | Discharge: 2023-05-12 | Disposition: A | Discharge status: Home / Self Care | Source: Ambulatory Visit | Attending: Neurosurgery | Admitting: Neurosurgery

## 2023-05-12 VITALS — BP 138/78 | Ht 65.0 in | Wt 194.0 lb

## 2023-05-12 DIAGNOSIS — G8929 Other chronic pain: Secondary | ICD-10-CM

## 2023-05-12 DIAGNOSIS — Z9889 Other specified postprocedural states: Secondary | ICD-10-CM | POA: Diagnosis not present

## 2023-05-12 DIAGNOSIS — M5440 Lumbago with sciatica, unspecified side: Secondary | ICD-10-CM | POA: Diagnosis present

## 2023-05-12 DIAGNOSIS — G5611 Other lesions of median nerve, right upper limb: Secondary | ICD-10-CM

## 2023-05-20 ENCOUNTER — Encounter: Payer: Self-pay | Admitting: Neurology

## 2023-05-21 ENCOUNTER — Other Ambulatory Visit: Payer: Self-pay

## 2023-05-21 DIAGNOSIS — R202 Paresthesia of skin: Secondary | ICD-10-CM

## 2023-05-28 ENCOUNTER — Encounter: Payer: Self-pay | Admitting: Neurosurgery

## 2023-06-21 ENCOUNTER — Encounter: Admitting: Neurology

## 2023-07-26 ENCOUNTER — Encounter: Admitting: Neurology

## 2023-09-22 ENCOUNTER — Other Ambulatory Visit
Admission: RE | Admit: 2023-09-22 | Discharge: 2023-09-22 | Disposition: A | Discharge status: Home / Self Care | Source: Ambulatory Visit | Attending: Specialist | Admitting: Specialist

## 2023-09-22 ENCOUNTER — Other Ambulatory Visit: Payer: Self-pay | Admitting: Specialist

## 2023-09-22 DIAGNOSIS — R0609 Other forms of dyspnea: Secondary | ICD-10-CM | POA: Diagnosis present

## 2023-09-22 DIAGNOSIS — J449 Chronic obstructive pulmonary disease, unspecified: Secondary | ICD-10-CM

## 2023-09-22 DIAGNOSIS — R7989 Other specified abnormal findings of blood chemistry: Secondary | ICD-10-CM

## 2023-09-22 LAB — D-DIMER, QUANTITATIVE: D-Dimer, Quant: 0.71 ug{FEU}/mL — ABNORMAL HIGH (ref 0.00–0.50)

## 2023-09-23 ENCOUNTER — Ambulatory Visit
Admission: RE | Admit: 2023-09-23 | Discharge: 2023-09-23 | Disposition: A | Discharge status: Home / Self Care | Source: Ambulatory Visit | Attending: Specialist | Admitting: Specialist

## 2023-09-23 DIAGNOSIS — R0609 Other forms of dyspnea: Secondary | ICD-10-CM | POA: Diagnosis present

## 2023-09-23 DIAGNOSIS — R7989 Other specified abnormal findings of blood chemistry: Secondary | ICD-10-CM | POA: Insufficient documentation

## 2023-09-23 DIAGNOSIS — J449 Chronic obstructive pulmonary disease, unspecified: Secondary | ICD-10-CM | POA: Diagnosis present

## 2023-09-23 MED ORDER — IOHEXOL 350 MG/ML SOLN
75.0000 mL | Freq: Once | INTRAVENOUS | Status: AC | PRN
  Administered 2023-09-23: 75 mL via INTRAVENOUS

## 2023-09-24 ENCOUNTER — Ambulatory Visit

## 2023-11-16 ENCOUNTER — Other Ambulatory Visit: Payer: Self-pay | Admitting: Acute Care

## 2023-11-16 DIAGNOSIS — Z87891 Personal history of nicotine dependence: Secondary | ICD-10-CM

## 2023-11-16 DIAGNOSIS — Z122 Encounter for screening for malignant neoplasm of respiratory organs: Secondary | ICD-10-CM

## 2023-11-16 DIAGNOSIS — F17210 Nicotine dependence, cigarettes, uncomplicated: Secondary | ICD-10-CM

## 2024-03-03 ENCOUNTER — Other Ambulatory Visit: Payer: Self-pay | Admitting: Podiatry

## 2024-03-17 ENCOUNTER — Other Ambulatory Visit: Payer: Self-pay

## 2024-03-17 ENCOUNTER — Encounter
Admission: RE | Admit: 2024-03-17 | Discharge: 2024-03-17 | Disposition: A | Source: Ambulatory Visit | Attending: Podiatry | Admitting: Podiatry

## 2024-03-17 DIAGNOSIS — Z01810 Encounter for preprocedural cardiovascular examination: Secondary | ICD-10-CM

## 2024-03-17 DIAGNOSIS — J984 Other disorders of lung: Secondary | ICD-10-CM

## 2024-03-17 DIAGNOSIS — Z01812 Encounter for preprocedural laboratory examination: Secondary | ICD-10-CM

## 2024-03-17 DIAGNOSIS — I10 Essential (primary) hypertension: Secondary | ICD-10-CM

## 2024-03-17 DIAGNOSIS — M79671 Pain in right foot: Secondary | ICD-10-CM

## 2024-03-17 HISTORY — DX: Cerebral infarction, unspecified: I63.9

## 2024-03-17 HISTORY — DX: Pneumonia, unspecified organism: J18.9

## 2024-03-17 NOTE — Patient Instructions (Addendum)
 Your procedure is scheduled on: 03/24/24 - Friday Report to the Registration Desk on the 1st floor of the [facility]. To find out your arrival time, please call [phone] between 1PM - 3PM on: 03/23/24 - Thursday If your arrival time is 6:00 am, do not arrive before that time as the Medical Mall entrance doors do not open until 6:00 am.  REMEMBER: Instructions that are not followed completely may result in serious medical risk, up to and including death; or upon the discretion of your surgeon and anesthesiologist your surgery may need to be rescheduled.  Do not eat food after midnight the night before surgery.  No gum chewing or hard candies.  You may however, drink CLEAR liquids up to 2 hours before you are scheduled to arrive for your surgery. Do not drink anything within 2 hours of your scheduled arrival time.  Clear liquids include: - water  - apple juice without pulp - gatorade (not RED colors) - black coffee or tea (Do NOT add milk or creamers to the coffee or tea) Do NOT drink anything that is not on this list.  In addition, your doctor has ordered for you to drink the provided:  Ensure Pre-Surgery Clear Carbohydrate Drink  Drinking this carbohydrate drink up to two hours before surgery helps to reduce insulin  resistance and improve patient outcomes. Please complete drinking 2 hours before scheduled arrival time.  One week prior to surgery: Stop Anti-inflammatories (NSAIDS) such as Advil , Aleve, Ibuprofen , Motrin , Naproxen, Naprosyn and Aspirin  based products such as Excedrin, Goody's Powder, BC Powder.You may take Tylenol  and oxyCODONE -acetaminophen   if needed for pain up until the day of surgery.  Stop ANY OVER THE COUNTER supplements until after surgery.  ON THE DAY OF SURGERY ONLY TAKE THESE MEDICATIONS WITH SIPS OF WATER:  ipratropium-albuterol  (DUONEB) nebulizer busPIRone (BUSPAR)  gabapentin  (NEURONTIN ) oxyCODONE  if needed for pain Proair  INH if needed  Use  inhalers on the day of surgery and bring to the hospital.   No Alcohol for 24 hours before or after surgery.  No Smoking including e-cigarettes for 24 hours before surgery.  No chewable tobacco products for at least 6 hours before surgery.  No nicotine  patches on the day of surgery.  Do not use any recreational drugs for at least a week (preferably 2 weeks) before your surgery.  Please be advised that the combination of cocaine and anesthesia may have negative outcomes, up to and including death. If you test positive for cocaine, your surgery will be cancelled.  On the morning of surgery brush your teeth with toothpaste and water, you may rinse your mouth with mouthwash if you wish. Do not swallow any toothpaste or mouthwash.  Use CHG Soap or wipes as directed on instruction sheet.  Do not wear jewelry, make-up, hairpins, clips or nail polish.  For welded (permanent) jewelry: bracelets, anklets, waist bands, etc.  Please have this removed prior to surgery.  If it is not removed, there is a chance that hospital personnel will need to cut it off on the day of surgery.  Do not wear lotions, powders, or perfumes.   Do not shave body hair from the neck down 48 hours before surgery.  Contact lenses, hearing aids and dentures may not be worn into surgery.  Do not bring valuables to the hospital. [facility] is not responsible for any missing/lost belongings or valuables.   Notify your doctor if there is any change in your medical condition (cold, fever, infection).  Wear comfortable  clothing (specific to your surgery type) to the hospital.  After surgery, you can help prevent lung complications by doing breathing exercises.  Take deep breaths and cough every 1-2 hours. Your doctor may order a device called an Incentive Spirometer to help you take deep breaths.  If you are being admitted to the hospital overnight, leave your suitcase in the car. After surgery it may be brought to your  room.  In case of increased patient census, it may be necessary for you, the patient, to continue your postoperative care in the Same Day Surgery department.  If you are being discharged the day of surgery, you will not be allowed to drive home. You will need a responsible individual to drive you home and stay with you for 24 hours after surgery.   If you are taking public transportation, you will need to have a responsible individual with you.  Please call the Pre-admissions Testing Dept. at [phone] if you have any questions about these instructions.  Surgery Visitation Policy:  Patients having surgery or a procedure may have two visitors.  Children under the age of 75 must have an adult with them who is not the patient.  Inpatient Visitation:    Visiting hours are 7 a.m. to 8 p.m. Up to four visitors are allowed at one time in a patient room. The visitors may rotate out with other people during the day.  One visitor age 33 or older may stay with the patient overnight and must be in the room by 8 p.m.   Merchandiser, Retail to address health-related social needs:  [email or web address]                                                                                                            Preparing for Surgery with CHLORHEXIDINE  GLUCONATE (CHG) Soap  Chlorhexidine  Gluconate (CHG) Soap  o An antiseptic cleaner that kills germs and bonds with the skin to continue killing germs even after washing  o Used for showering the night before surgery and morning of surgery  Before surgery, you can play an important role by reducing the number of germs on your skin.  CHG (Chlorhexidine  gluconate) soap is an antiseptic cleanser which kills germs and bonds with the skin to continue killing germs even after washing.  Please do not use if you have an allergy to CHG or antibacterial soaps. If your skin becomes reddened/irritated stop using the CHG.  1. Shower the  NIGHT BEFORE SURGERY with CHG soap.  2. If you choose to wash your hair, wash your hair first as usual with your normal shampoo.  3. After shampooing, rinse your hair and body thoroughly to remove the shampoo.  4. Use CHG as you would any other liquid soap. You can apply CHG directly to the skin and wash gently with a clean washcloth.  5. Apply the CHG soap to your body only from the neck down. Do not use on open wounds or open sores. Avoid contact with your eyes, ears, mouth, and genitals (private parts).  Wash face and genitals (private parts) with your normal soap.  6. Wash thoroughly, paying special attention to the area where your surgery will be performed.  7. Thoroughly rinse your body with warm water.  8. Do not shower/wash with your normal soap after using and rinsing off the CHG soap.  9. Do not use lotions, oils, etc., after showering with CHG.  10. Pat yourself dry with a clean towel.  11. Wear clean pajamas to bed the night before surgery.  12. Place clean sheets on your bed the night of your shower and do not sleep with pets.  13. Do not apply any deodorants/lotions/powders.  14. Please wear clean clothes to the hospital.  15. Remember to brush your teeth with your regular toothpaste.

## 2024-03-22 ENCOUNTER — Other Ambulatory Visit

## 2024-03-22 ENCOUNTER — Encounter
Admission: RE | Admit: 2024-03-22 | Discharge: 2024-03-22 | Disposition: A | Discharge status: Home / Self Care | Source: Ambulatory Visit | Attending: Podiatry | Admitting: Podiatry

## 2024-03-22 DIAGNOSIS — Z01810 Encounter for preprocedural cardiovascular examination: Secondary | ICD-10-CM

## 2024-03-22 DIAGNOSIS — Z01812 Encounter for preprocedural laboratory examination: Secondary | ICD-10-CM | POA: Diagnosis present

## 2024-03-22 DIAGNOSIS — I10 Essential (primary) hypertension: Secondary | ICD-10-CM | POA: Insufficient documentation

## 2024-03-22 DIAGNOSIS — Z01818 Encounter for other preprocedural examination: Secondary | ICD-10-CM | POA: Diagnosis not present

## 2024-03-22 DIAGNOSIS — J984 Other disorders of lung: Secondary | ICD-10-CM | POA: Insufficient documentation

## 2024-03-22 LAB — CBC
HCT: 41.9 % (ref 39.0–52.0)
Hemoglobin: 14 g/dL (ref 13.0–17.0)
MCH: 27.8 pg (ref 26.0–34.0)
MCHC: 33.4 g/dL (ref 30.0–36.0)
MCV: 83.3 fL (ref 80.0–100.0)
Platelets: 243 10*3/uL (ref 150–400)
RBC: 5.03 MIL/uL (ref 4.22–5.81)
RDW: 12.8 % (ref 11.5–15.5)
WBC: 7.6 10*3/uL (ref 4.0–10.5)
nRBC: 0 % (ref 0.0–0.2)

## 2024-03-22 LAB — BASIC METABOLIC PANEL WITH GFR
Anion gap: 10 (ref 5–15)
BUN: 11 mg/dL (ref 8–23)
CO2: 26 mmol/L (ref 22–32)
Calcium: 9 mg/dL (ref 8.9–10.3)
Chloride: 103 mmol/L (ref 98–111)
Creatinine, Ser: 0.85 mg/dL (ref 0.61–1.24)
GFR, Estimated: >60 mL/min
Glucose, Bld: 101 mg/dL — ABNORMAL HIGH (ref 70–99)
Potassium: 4 mmol/L (ref 3.5–5.1)
Sodium: 139 mmol/L (ref 135–145)

## 2024-03-23 MED ORDER — LACTATED RINGERS IV SOLN: INTRAVENOUS | Status: DC

## 2024-03-23 MED ORDER — CHLORHEXIDINE GLUCONATE 0.12 % MT SOLN
15.0000 mL | Freq: Once | OROMUCOSAL | Status: AC
  Administered 2024-03-24: 15 mL via OROMUCOSAL

## 2024-03-23 MED ORDER — CEFAZOLIN SODIUM-DEXTROSE 2-4 GM/100ML-% IV SOLN
2.0000 g | INTRAVENOUS | Status: AC
  Administered 2024-03-24: 2 g via INTRAVENOUS

## 2024-03-23 MED ORDER — ORAL CARE MOUTH RINSE: 15.0000 mL | Freq: Once | OROMUCOSAL | Status: AC

## 2024-03-24 ENCOUNTER — Ambulatory Visit

## 2024-03-24 ENCOUNTER — Encounter: Payer: Self-pay | Admitting: Podiatry

## 2024-03-24 ENCOUNTER — Encounter
Admission: RE | Disposition: A | Discharge status: Home / Self Care | Payer: Self-pay | Source: Home / Self Care | Attending: Podiatry

## 2024-03-24 ENCOUNTER — Other Ambulatory Visit: Payer: Self-pay

## 2024-03-24 ENCOUNTER — Ambulatory Visit: Payer: Self-pay | Admitting: Anesthesiology

## 2024-03-24 ENCOUNTER — Ambulatory Visit: Payer: Self-pay | Admitting: Urgent Care

## 2024-03-24 ENCOUNTER — Ambulatory Visit
Admission: RE | Admit: 2024-03-24 | Discharge: 2024-03-24 | Disposition: A | Discharge status: Home / Self Care | Attending: Podiatry | Admitting: Podiatry

## 2024-03-24 MED ORDER — DEXAMETHASONE SOD PHOSPHATE PF 10 MG/ML IJ SOLN
INTRAMUSCULAR | Status: AC
  Filled 2024-03-24: qty 1

## 2024-03-24 MED ORDER — FENTANYL CITRATE (PF) 100 MCG/2ML IJ SOLN
INTRAMUSCULAR | Status: DC | PRN
  Administered 2024-03-24 (×4): 25 ug via INTRAVENOUS

## 2024-03-24 MED ORDER — MIDAZOLAM HCL (PF) 2 MG/2ML IJ SOLN
INTRAMUSCULAR | Status: DC | PRN
  Administered 2024-03-24 (×2): 1 mg via INTRAVENOUS

## 2024-03-24 MED ORDER — CEFAZOLIN SODIUM-DEXTROSE 2-4 GM/100ML-% IV SOLN
INTRAVENOUS | Status: AC
  Filled 2024-03-24: qty 100

## 2024-03-24 MED ORDER — PROPOFOL 10 MG/ML IV BOLUS
INTRAVENOUS | Status: DC | PRN
  Administered 2024-03-24: 170 mg via INTRAVENOUS

## 2024-03-24 MED ORDER — PHENYLEPHRINE 80 MCG/ML (10ML) SYRINGE FOR IV PUSH (FOR BLOOD PRESSURE SUPPORT)
PREFILLED_SYRINGE | INTRAVENOUS | Status: DC | PRN
  Administered 2024-03-24 (×2): 80 ug via INTRAVENOUS
  Administered 2024-03-24: 40 ug via INTRAVENOUS

## 2024-03-24 MED ORDER — EPHEDRINE 5 MG/ML INJ
INTRAVENOUS | Status: AC
  Filled 2024-03-24: qty 5

## 2024-03-24 MED ORDER — PHENYLEPHRINE 80 MCG/ML (10ML) SYRINGE FOR IV PUSH (FOR BLOOD PRESSURE SUPPORT)
PREFILLED_SYRINGE | INTRAVENOUS | Status: AC
  Filled 2024-03-24: qty 10

## 2024-03-24 MED ORDER — ONDANSETRON HCL 4 MG/2ML IJ SOLN
INTRAMUSCULAR | Status: AC
  Filled 2024-03-24: qty 2

## 2024-03-24 MED ORDER — EPHEDRINE SULFATE-NACL 50-0.9 MG/10ML-% IV SOSY
PREFILLED_SYRINGE | INTRAVENOUS | Status: DC | PRN
  Administered 2024-03-24: 5 mg via INTRAVENOUS
  Administered 2024-03-24: 10 mg via INTRAVENOUS

## 2024-03-24 MED ORDER — LIDOCAINE HCL (CARDIAC) PF 100 MG/5ML IV SOSY
PREFILLED_SYRINGE | INTRAVENOUS | Status: DC | PRN
  Administered 2024-03-24: 100 mg via INTRAVENOUS

## 2024-03-24 MED ORDER — MIDAZOLAM HCL 2 MG/2ML IJ SOLN
INTRAMUSCULAR | Status: AC
  Filled 2024-03-24: qty 2

## 2024-03-24 MED ORDER — GLYCOPYRROLATE 0.2 MG/ML IJ SOLN
INTRAMUSCULAR | Status: DC | PRN
  Administered 2024-03-24: .2 mg via INTRAVENOUS

## 2024-03-24 MED ORDER — ACETAMINOPHEN 10 MG/ML IV SOLN
INTRAVENOUS | Status: DC | PRN
  Administered 2024-03-24: 1000 mg via INTRAVENOUS

## 2024-03-24 MED ORDER — CHLORHEXIDINE GLUCONATE 0.12 % MT SOLN
OROMUCOSAL | Status: AC
  Filled 2024-03-24: qty 15

## 2024-03-24 MED ORDER — LACTATED RINGERS IV SOLN: INTRAVENOUS | Status: DC | PRN

## 2024-03-24 MED ORDER — 0.9 % SODIUM CHLORIDE (POUR BTL) OPTIME
TOPICAL | Status: DC | PRN
  Administered 2024-03-24: 1000 mL

## 2024-03-24 MED ORDER — BUPIVACAINE HCL (PF) 0.5 % IJ SOLN
INTRAMUSCULAR | Status: DC | PRN
  Administered 2024-03-24: 10 mL

## 2024-03-24 MED ORDER — FENTANYL CITRATE (PF) 100 MCG/2ML IJ SOLN
INTRAMUSCULAR | Status: AC
  Filled 2024-03-24: qty 2

## 2024-03-24 MED ORDER — DEXAMETHASONE SOD PHOSPHATE PF 10 MG/ML IJ SOLN
INTRAMUSCULAR | Status: DC | PRN
  Administered 2024-03-24: 10 mg via INTRAVENOUS

## 2024-03-24 MED ORDER — BUPIVACAINE LIPOSOME 1.3 % IJ SUSP
INTRAMUSCULAR | Status: DC | PRN
  Administered 2024-03-24: 10 mL

## 2024-03-24 MED ORDER — ONDANSETRON HCL 4 MG/2ML IJ SOLN
INTRAMUSCULAR | Status: DC | PRN
  Administered 2024-03-24: 4 mg via INTRAVENOUS

## 2024-03-24 MED ORDER — ACETAMINOPHEN 10 MG/ML IV SOLN
INTRAVENOUS | Status: AC
  Filled 2024-03-24: qty 100

## 2024-03-24 MED ORDER — LIDOCAINE HCL (PF) 2 % IJ SOLN
INTRAMUSCULAR | Status: AC
  Filled 2024-03-24: qty 5

## 2024-03-24 NOTE — Transfer of Care (Signed)
 Immediate Anesthesia Transfer of Care Note  Patient: Bruce Harding  Procedure(s) Performed: FUSION, JOINT, GREAT TOE (Right: Toe)  Patient Location: PACU  Anesthesia Type:General  Level of Consciousness: drowsy  Airway & Oxygen Therapy: Patient Spontanous Breathing and Patient connected to face mask oxygen  Post-op Assessment: Report given to RN and Post -op Vital signs reviewed and stable  Post vital signs: Reviewed and stable  Last Vitals:  Vitals Value Taken Time  BP 165/108 03/24/24 10:33  Temp    Pulse 80 03/24/24 10:36  Resp 11 03/24/24 10:36  SpO2 100 % 03/24/24 10:36  Vitals shown include unfiled device data.  Last Pain:  Vitals:   03/24/24 0739  PainSc: 10-Worst pain ever         Complications: No notable events documented.

## 2024-03-24 NOTE — Discharge Instructions (Signed)
[facility]  POST OPERATIVE INSTRUCTIONS FOR DR. Ether Griffins AND DR. BAKER [facility]   Take your medication as prescribed.  Pain medication should be taken only as needed.  Keep the dressing clean, dry and intact.  Keep your foot elevated above the heart level for the first 48 hours.  We have instructed you to be non-weight bearing.  Always wear your post-op shoe when walking.  Always use your crutches if you are to be non-weight bearing.  Do not take a shower. Baths are permissible as long as the foot is kept out of the water.   Every hour you are awake:  Bend your knee 15 times.   Call [facility] [phone]) if any of the following problems occur: You develop a temperature or fever. The bandage becomes saturated with blood. Medication does not stop your pain. Injury of the foot occurs. Any symptoms of infection including redness, odor, or red streaks running from wound.

## 2024-03-24 NOTE — Anesthesia Procedure Notes (Signed)
 Procedure Name: LMA Insertion Date/Time: 03/24/2024 8:59 AM  Performed by: Duwayne Craven, CRNAPre-anesthesia Checklist: Patient identified, Patient being monitored, Timeout performed, Emergency Drugs available and Suction available Patient Re-evaluated:Patient Re-evaluated prior to induction Oxygen Delivery Method: Circle system utilized Preoxygenation: Pre-oxygenation with 100% oxygen Induction Type: IV induction Ventilation: Mask ventilation without difficulty LMA: LMA inserted LMA Size: 4.0 Tube type: Oral Number of attempts: 1 Placement Confirmation: positive ETCO2 and breath sounds checked- equal and bilateral Tube secured with: Tape Dental Injury: Teeth and Oropharynx as per pre-operative assessment

## 2024-03-24 NOTE — Anesthesia Postprocedure Evaluation (Signed)
"   Anesthesia Post Note  Patient: Bruce Harding  Procedure(s) Performed: FUSION, JOINT, GREAT TOE (Right: Toe)  Patient location during evaluation: PACU Anesthesia Type: General Level of consciousness: awake and alert, oriented and patient cooperative Pain management: pain level controlled Vital Signs Assessment: post-procedure vital signs reviewed and stable Respiratory status: spontaneous breathing, nonlabored ventilation and respiratory function stable Cardiovascular status: blood pressure returned to baseline and stable Postop Assessment: adequate PO intake Anesthetic complications: no   No notable events documented.   Last Vitals:  Vitals:   03/24/24 1100 03/24/24 1125  BP: (!) 129/92 (!) 160/91  Pulse: 78 81  Resp: 13 18  Temp:  (!) 36.1 C  SpO2: 98% 98%    Last Pain:  Vitals:   03/24/24 1125  TempSrc: Temporal  PainSc: 0-No pain                 Alfonso Ruths      "

## 2024-03-24 NOTE — Op Note (Signed)
 Operative note   Surgeon:Odelle Kosier [employer]/support/administrative Officer: None    Preop diagnosis: Hallux rigidus right first MTPJ    Postop diagnosis: Same    Procedure: 1.  Arthrodesis right first MTPJ 2.  Intraoperative fluoroscopy use without assistance of radiologist    EBL: Minimal    Anesthesia: General With local.  Local consists of a total of 20 cc of one-to-one mixture of 0.5% plain bupivacaine  and Exparel  long-acting anesthetic    Hemostasis: Mid calf tourniquet inflated to 200 mmHg for 58 minutes    Specimen: None    Complications: None    Operative indications:Fermon V Fok is an 64 y.o. that presents today for surgical intervention.  The risks/benefits/alternatives/complications have been discussed and consent has been given.    Procedure:  Patient was brought into the OR and placed on the operating table in thesupine position. After anesthesia was obtained theright lower extremity was prepped and draped in usual sterile fashion.  Attention was directed to the dorsal medial right first MTPJ where longitudinal incision was performed.  Sharp and blunt dissection carried down to the capsule.  Longitudinal capsulotomy was then performed.  This exposed the head of the metatarsal and base of the proximal phalanx.  At this time the joint was evaluated and a large articular defect was noted in the metatarsal head with exposed subchondral bone.  All the residual articular cartilage was initially removed with a curette.  A prominent medial eminence was removed with a power saw.  Next using the cup and cone reamer the surfaces were prepped and taken down to the subchondral bone plate.  The wound was then flushed with copious amounts of irrigation.  The metatarsophalangeal joint was further prepared with a 2 oh millimeter drill bit.  The toe was held in a neutral position at approximately 5 degrees of valgus and neutral to the plantar foot.  This was stabilized with the plate and a crossing wire.  2.7  locking screws were placed distal from the [employer] MTPJ fusion set overlying the plate.  A compression screw was placed proximal.  Good alignment was noted and fluoroscopy was used to assess alignment and positioning which was found to be in good shape.  The proximal screws were then filled with 3.5 millimeter screws and the distal screws filled with 2.7 mm.  Final fluoroscopy revealed good alignment in all planes.  The wound was flushed with copious amounts of irrigation.  Closure was performed with a 3-0 Vicryl the subcutaneous tissue and capsular tissue.  The skin was closed with a 4-0 Monocryl.  A bulky sterile dressing was applied and the patient was placed in a equalizer walker for nonweightbearing.    Patient tolerated the procedure and anesthesia well.  Was transported from the OR to the PACU with all vital signs stable and vascular status intact. To be discharged per routine protocol.  Will follow up in approximately 1 week in the outpatient clinic.

## 2024-03-24 NOTE — H&P (Signed)
 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  The various methods of treatment have been discussed with the patient.  No guarantees were given.  After consideration of risks, benefits and other options for treatment, the patient has consented to surgery.  I have reviewed the patients chart and labs.     A history and physical examination was performed in my office.  The patient was reexamined.  There have been no changes to this history and physical examination.  Ashley Soulier A

## 2024-03-27 ENCOUNTER — Encounter: Payer: Self-pay | Admitting: Podiatry

## 2024-03-28 ENCOUNTER — Encounter: Payer: Self-pay | Admitting: Podiatry

## 2024-03-29 ENCOUNTER — Encounter: Payer: Self-pay | Admitting: Podiatry
